# Patient Record
Sex: Male | Born: 1950 | Race: White | Hispanic: No | Marital: Married | Smoking: Never smoker
Health system: Southern US, Community
[De-identification: ages and names within clinical notes are randomized; demographics above are authoritative.]

## PROBLEM LIST (undated history)

## (undated) DIAGNOSIS — M1990 Unspecified osteoarthritis, unspecified site: Secondary | ICD-10-CM

## (undated) DIAGNOSIS — F419 Anxiety disorder, unspecified: Secondary | ICD-10-CM

## (undated) DIAGNOSIS — H9190 Unspecified hearing loss, unspecified ear: Secondary | ICD-10-CM

## (undated) DIAGNOSIS — R7303 Prediabetes: Secondary | ICD-10-CM

## (undated) DIAGNOSIS — K635 Polyp of colon: Secondary | ICD-10-CM

## (undated) DIAGNOSIS — I10 Essential (primary) hypertension: Secondary | ICD-10-CM

## (undated) DIAGNOSIS — Z87442 Personal history of urinary calculi: Secondary | ICD-10-CM

## (undated) DIAGNOSIS — G4730 Sleep apnea, unspecified: Secondary | ICD-10-CM

## (undated) DIAGNOSIS — F32A Depression, unspecified: Secondary | ICD-10-CM

## (undated) DIAGNOSIS — H269 Unspecified cataract: Secondary | ICD-10-CM

## (undated) DIAGNOSIS — C61 Malignant neoplasm of prostate: Secondary | ICD-10-CM

## (undated) DIAGNOSIS — E7800 Pure hypercholesterolemia, unspecified: Secondary | ICD-10-CM

## (undated) DIAGNOSIS — M2550 Pain in unspecified joint: Secondary | ICD-10-CM

## (undated) HISTORY — PX: SHOULDER SURGERY: SHX246

## (undated) HISTORY — DX: Sleep apnea, unspecified: G47.30

## (undated) HISTORY — PX: WISDOM TOOTH EXTRACTION: SHX21

## (undated) HISTORY — PX: EYE SURGERY: SHX253

## (undated) HISTORY — PX: REPLACEMENT TOTAL KNEE: SUR1224

## (undated) HISTORY — DX: Unspecified osteoarthritis, unspecified site: M19.90

## (undated) HISTORY — PX: PROSTATECTOMY: SHX69

## (undated) HISTORY — DX: Polyp of colon: K63.5

## (undated) HISTORY — PX: LASIK: SHX215

## (undated) HISTORY — DX: Depression, unspecified: F32.A

## (undated) HISTORY — DX: Malignant neoplasm of prostate: C61

## (undated) HISTORY — PX: TOTAL HIP ARTHROPLASTY: SHX124

## (undated) HISTORY — DX: Unspecified cataract: H26.9

## (undated) HISTORY — DX: Pain in unspecified joint: M25.50

## (undated) HISTORY — PX: JOINT REPLACEMENT: SHX530

---

## 2011-12-29 DIAGNOSIS — N529 Male erectile dysfunction, unspecified: Secondary | ICD-10-CM | POA: Insufficient documentation

## 2013-01-16 HISTORY — PX: REPLACEMENT TOTAL KNEE: SUR1224

## 2014-02-15 DIAGNOSIS — E785 Hyperlipidemia, unspecified: Secondary | ICD-10-CM | POA: Insufficient documentation

## 2014-06-01 DIAGNOSIS — Z96651 Presence of right artificial knee joint: Secondary | ICD-10-CM | POA: Insufficient documentation

## 2015-06-21 DIAGNOSIS — K5730 Diverticulosis of large intestine without perforation or abscess without bleeding: Secondary | ICD-10-CM | POA: Insufficient documentation

## 2015-06-21 DIAGNOSIS — D126 Benign neoplasm of colon, unspecified: Secondary | ICD-10-CM | POA: Insufficient documentation

## 2015-10-23 DIAGNOSIS — E538 Deficiency of other specified B group vitamins: Secondary | ICD-10-CM | POA: Insufficient documentation

## 2016-05-30 DIAGNOSIS — C61 Malignant neoplasm of prostate: Secondary | ICD-10-CM | POA: Insufficient documentation

## 2018-05-22 DIAGNOSIS — Z86711 Personal history of pulmonary embolism: Secondary | ICD-10-CM | POA: Insufficient documentation

## 2019-07-24 DIAGNOSIS — H25812 Combined forms of age-related cataract, left eye: Secondary | ICD-10-CM | POA: Insufficient documentation

## 2019-11-17 DIAGNOSIS — R7303 Prediabetes: Secondary | ICD-10-CM | POA: Insufficient documentation

## 2020-08-02 DIAGNOSIS — I10 Essential (primary) hypertension: Secondary | ICD-10-CM | POA: Insufficient documentation

## 2020-09-02 ENCOUNTER — Emergency Department
Admission: EM | Admit: 2020-09-02 | Discharge: 2020-09-02 | Disposition: A | Discharge status: Home / Self Care | Payer: Medicare Other | Source: Home / Self Care

## 2020-09-02 DIAGNOSIS — M5417 Radiculopathy, lumbosacral region: Secondary | ICD-10-CM | POA: Diagnosis not present

## 2020-09-02 DIAGNOSIS — M62830 Muscle spasm of back: Secondary | ICD-10-CM | POA: Diagnosis not present

## 2020-09-02 HISTORY — DX: Essential (primary) hypertension: I10

## 2020-09-02 HISTORY — DX: Pure hypercholesterolemia, unspecified: E78.00

## 2020-09-02 MED ORDER — PREDNISONE 20 MG PO TABS: ORAL_TABLET | ORAL | 0 refills | Status: DC

## 2020-09-02 MED ORDER — METHYLPREDNISOLONE SODIUM SUCC 125 MG IJ SOLR
125.0000 mg | Freq: Once | INTRAMUSCULAR | Status: AC
  Administered 2020-09-02: 125 mg via INTRAMUSCULAR

## 2020-09-02 MED ORDER — BACLOFEN 10 MG PO TABS: 10.0000 mg | ORAL_TABLET | Freq: Three times a day (TID) | ORAL | 0 refills | Status: DC

## 2020-09-02 NOTE — ED Provider Notes (Signed)
[name]    CSN: [id number] Arrival date & time: 09/02/20  1642      History   Chief Complaint Chief Complaint  Patient presents with   Back Pain    HPI Randall Hanson is a 70 y.o. male.   HPI Very pleasant 70 year old male presents with low back pain for 1 week.  Reports taking Tylenol for pain with little to no relief.  Reports lower back pain increases with movement and quickly worsens.  Patient is accompanied by his wife this afternoon.  Past Medical History:  Diagnosis Date   High cholesterol    Hypertension     There are no problems to display for this patient.   Past Surgical History:  Procedure Laterality Date   PROSTATECTOMY     REPLACEMENT TOTAL KNEE     SHOULDER SURGERY         Home Medications    Prior to Admission medications   Medication Sig Start Date End Date Taking? Authorizing Provider  baclofen (LIORESAL) 10 MG tablet Take 1 tablet (10 mg total) by mouth 3 (three) times daily. 09/02/20  Yes Eliezer Lofts, FNP  lisinopril (ZESTRIL) 10 MG tablet Take 10 mg by mouth daily.   Yes [provider]  predniSONE (DELTASONE) 20 MG tablet Take 3 tabs PO daily x 3 days, then 2 tabs PO daily x 3 days, then 1 tab PO daily x 3 days 09/02/20  Yes Eliezer Lofts, FNP  simvastatin (ZOCOR) 40 MG tablet Take 40 mg by mouth daily.   Yes [provider]  traZODone (DESYREL) 25 mg TABS tablet Take 25 mg by mouth at bedtime.   Yes [provider]  vitamin B-12 (CYANOCOBALAMIN) 500 MCG tablet Take 500 mcg by mouth daily.   Yes [provider]    Family History History reviewed. No pertinent family history.  Social History Social History   Tobacco Use   Smoking status: Never   Smokeless tobacco: Never  Vaping Use   Vaping Use: Never used  Substance Use Topics   Alcohol use: Never   Drug use: Never     Allergies   Patient has no known allergies.   Review of Systems Review of Systems   Musculoskeletal:  Positive for back pain.  All other systems reviewed and are negative.   Physical Exam Triage Vital Signs ED Triage Vitals  Enc Vitals Group     BP 09/02/20 1700 (!) 168/80     Pulse Rate 09/02/20 1700 61     Resp 09/02/20 1700 14     Temp 09/02/20 1700 98.2 F (36.8 C)     Temp Source 09/02/20 1700 Oral     SpO2 09/02/20 1700 98 %     Weight 09/02/20 1703 211 lb (95.7 kg)     Height 09/02/20 1703 '5\' 10"'$  (1.778 m)     Head Circumference --      Peak Flow --      Pain Score 09/02/20 1702 3     Pain Loc --      Pain Edu? --      Excl. in [place]? --    No data found.  Updated Vital Signs BP (!) 168/80 (BP Location: Left Arm)   Pulse 61   Temp 98.2 F (36.8 C) (Oral)   Resp 14   Ht '5\' 10"'$  (1.778 m)   Wt 211 lb (95.7 kg)   SpO2 98%   BMI 30.28 kg/m      Physical  Exam Vitals and nursing note reviewed.  Constitutional:      General: He is not in acute distress.    Appearance: Normal appearance. He is normal weight. He is not ill-appearing.  HENT:     Head: Normocephalic and atraumatic.     Mouth/Throat:     Mouth: Mucous membranes are moist.     Pharynx: Oropharynx is clear.  Eyes:     Extraocular Movements: Extraocular movements intact.     Conjunctiva/sclera: Conjunctivae normal.     Pupils: Pupils are equal, round, and reactive to light.  Cardiovascular:     Rate and Rhythm: Normal rate and regular rhythm.     Pulses: Normal pulses.     Heart sounds: Normal heart sounds.  Pulmonary:     Effort: Pulmonary effort is normal.     Breath sounds: Normal breath sounds. No wheezing, rhonchi or rales.  Musculoskeletal:        General: Normal range of motion.     Cervical back: Normal range of motion and neck supple. No rigidity or tenderness.     Comments: Lumbar sacral spine: TTP over spinous processes, paraspinous muscles bilaterally, and inferior spinal erectors bilaterally, flattening of normal lordosis, numerous palpable muscle adhesions, no  deformity noted  Lymphadenopathy:     Cervical: No cervical adenopathy.  Skin:    General: Skin is warm and dry.  Neurological:     General: No focal deficit present.     Mental Status: He is alert and oriented to person, place, and time. Mental status is at baseline.  Psychiatric:        Mood and Affect: Mood normal.        Behavior: Behavior normal.        Thought Content: Thought content normal.     UC Treatments / Results  Labs (all labs ordered are listed, but only abnormal results are displayed) Labs Reviewed - No data to display  EKG   Radiology No results found.  Procedures Procedures (including critical care time)  Medications Ordered in UC Medications  methylPREDNISolone sodium succinate (SOLU-MEDROL) 125 mg/2 mL injection 125 mg (125 mg Intramuscular Given 09/02/20 1730)    Initial Impression / Assessment and Plan / UC Course  I have reviewed the triage vital signs and the nursing notes.  Pertinent labs & imaging results that were available during my care of the patient were reviewed by me and considered in my medical decision making (see chart for details).     MDM: 1.  Lumbar sacral radiculopathy-IM Solu-Medrol 125 given once in clinic prior to discharge, Rx'd Prednisone taper; 2.  Muscle spasm of back-Rx'd Baclofen. Advised/instructed patient to start oral prednisone taper tomorrow, Friday, 09/03/2020.  Advised patient to take medications as directed with food to completion.  Encouraged patient to increase daily water intake while taking these medications.  Advised/encouraged patient to avoid moderate to strenuous activities, including lifting, bending over, pushing, pulling, or other repetitive motion activities involving affected area of lower back for the next 7 to 10 days.   Final Clinical Impressions(s) / UC Diagnoses   Final diagnoses:  Muscle spasm of back  Lumbosacral radiculopathy     Discharge Instructions      Advised/instructed patient  to start oral prednisone taper tomorrow, Friday, 09/03/2020.  Advised patient to take medications as directed with food to completion.  Encouraged patient to increase daily water intake while taking these medications.  Advised/encouraged patient to avoid moderate to strenuous activities, including lifting, bending over, pushing,  pulling, or other repetitive motion activities involving affected area of lower back for the next 7 to 10 days.     ED Prescriptions     Medication Sig Dispense Auth. Provider   predniSONE (DELTASONE) 20 MG tablet Take 3 tabs PO daily x 3 days, then 2 tabs PO daily x 3 days, then 1 tab PO daily x 3 days 18 tablet Eliezer Lofts, FNP   baclofen (LIORESAL) 10 MG tablet Take 1 tablet (10 mg total) by mouth 3 (three) times daily. 61 each Eliezer Lofts, FNP      PDMP not reviewed this encounter.   Eliezer Lofts, [place] 09/02/20 1742

## 2020-09-02 NOTE — Discharge Instructions (Addendum)
Advised/instructed patient to start oral prednisone taper tomorrow, Friday, 09/03/2020.  Advised patient to take medications as directed with food to completion.  Encouraged patient to increase daily water intake while taking these medications.  Advised/encouraged patient to avoid moderate to strenuous activities, including lifting, bending over, pushing, pulling, or other repetitive motion activities involving affected area of lower back for the next 7 to 10 days.

## 2020-09-02 NOTE — ED Triage Notes (Signed)
Pt presents with lower back pain x1 week. Pt is taking tylenol for pain. Pain increases with movement and becomes severe

## 2020-09-16 ENCOUNTER — Ambulatory Visit (INDEPENDENT_AMBULATORY_CARE_PROVIDER_SITE_OTHER): Payer: Medicare Other | Admitting: Physician Assistant

## 2020-09-16 ENCOUNTER — Encounter: Payer: Self-pay | Admitting: Physician Assistant

## 2020-09-16 ENCOUNTER — Ambulatory Visit (INDEPENDENT_AMBULATORY_CARE_PROVIDER_SITE_OTHER): Payer: Medicare Other

## 2020-09-16 ENCOUNTER — Other Ambulatory Visit: Payer: Self-pay

## 2020-09-16 VITALS — BP 136/65 | HR 73 | Ht 70.0 in | Wt 209.0 lb

## 2020-09-16 DIAGNOSIS — G8929 Other chronic pain: Secondary | ICD-10-CM | POA: Insufficient documentation

## 2020-09-16 DIAGNOSIS — I10 Essential (primary) hypertension: Secondary | ICD-10-CM

## 2020-09-16 DIAGNOSIS — Z23 Encounter for immunization: Secondary | ICD-10-CM | POA: Diagnosis not present

## 2020-09-16 DIAGNOSIS — M8949 Other hypertrophic osteoarthropathy, multiple sites: Secondary | ICD-10-CM | POA: Insufficient documentation

## 2020-09-16 DIAGNOSIS — M1612 Unilateral primary osteoarthritis, left hip: Secondary | ICD-10-CM | POA: Insufficient documentation

## 2020-09-16 DIAGNOSIS — G4700 Insomnia, unspecified: Secondary | ICD-10-CM | POA: Insufficient documentation

## 2020-09-16 DIAGNOSIS — H903 Sensorineural hearing loss, bilateral: Secondary | ICD-10-CM | POA: Insufficient documentation

## 2020-09-16 DIAGNOSIS — M5442 Lumbago with sciatica, left side: Secondary | ICD-10-CM

## 2020-09-16 DIAGNOSIS — G4733 Obstructive sleep apnea (adult) (pediatric): Secondary | ICD-10-CM | POA: Diagnosis not present

## 2020-09-16 DIAGNOSIS — F411 Generalized anxiety disorder: Secondary | ICD-10-CM | POA: Insufficient documentation

## 2020-09-16 DIAGNOSIS — M47816 Spondylosis without myelopathy or radiculopathy, lumbar region: Secondary | ICD-10-CM | POA: Insufficient documentation

## 2020-09-16 DIAGNOSIS — M4316 Spondylolisthesis, lumbar region: Secondary | ICD-10-CM | POA: Insufficient documentation

## 2020-09-16 DIAGNOSIS — F419 Anxiety disorder, unspecified: Secondary | ICD-10-CM | POA: Insufficient documentation

## 2020-09-16 DIAGNOSIS — M159 Polyosteoarthritis, unspecified: Secondary | ICD-10-CM

## 2020-09-16 MED ORDER — BELSOMRA 10 MG PO TABS: 1.0000 | ORAL_TABLET | Freq: Every day | ORAL | 5 refills | Status: DC

## 2020-09-16 MED ORDER — ALPRAZOLAM 0.25 MG PO TABS: ORAL_TABLET | ORAL | 0 refills | Status: DC

## 2020-09-16 MED ORDER — MELOXICAM 15 MG PO TABS: 15.0000 mg | ORAL_TABLET | Freq: Every day | ORAL | 2 refills | Status: DC

## 2020-09-16 NOTE — Progress Notes (Signed)
New Patient Office Visit  Subjective:  Patient ID: Randall Hanson, male    DOB: 25-Jan-1950  Age: 70 y.o. MRN: [id number]  CC:  Chief Complaint  Patient presents with   Establish Care    HPI [name] presents to establish care.   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 He was very upset that day over many things. He has been checking BP at home and running 120s/130s over 70s. No CP, palpitations, headaches, vision changes.   He is also having trouble sleeping. Not getting to sleep but staying asleep. He wakes up and just cannot get back to sleep. He tried trazodone and made his eyes water and vision was blurry.   His low back pain with radiation into left buttocks was better but starting to come back. Hx of low back pain but not the radiation and not this bad. He went to urgent care and solumedrol shot '125mg'$  worked amazing all over his body but his pain is starting to come back. He is taking tylenol but nothing else to help with pain. He denies any injuries. He has OA all over his body with hands, knees, shoulders and back. Denies any bowel or bladder dysfunction, saddle anesthesia, leg weakness.   Past Medical History:  Diagnosis Date   High cholesterol    Hypertension     Past Surgical History:  Procedure Laterality Date   PROSTATECTOMY     REPLACEMENT TOTAL KNEE     SHOULDER SURGERY      History reviewed. No pertinent family history.  Social History   Socioeconomic History   Marital status: Married    Spouse name: Not on file   Number of children: Not on file   Years of education: Not on file   Highest education level: Not on file  Occupational History   Not on file  Tobacco Use   Smoking status: Never   Smokeless tobacco: Never  Vaping Use   Vaping Use: Never used  Substance and Sexual Activity   Alcohol use: Never   Drug use: Never   Sexual activity: Not on file  Other Topics Concern   Not on file   Social History Narrative   Not on file   Social Determinants of Health   Financial Resource Strain: Not on file  Food Insecurity: Not on file  Transportation Needs: Not on file  Physical Activity: Not on file  Stress: Not on file  Social Connections: Not on file  Intimate Partner Violence: Not on file    ROS Review of Systems See HPI. Objective:   Today's Vitals: BP 136/65   Pulse 73   Ht '5\' 10"'$  (1.778 m)   Wt 209 lb (94.8 kg)   SpO2 99%   BMI 29.99 kg/m   Physical Exam Vitals reviewed.  Constitutional:      Appearance: Normal appearance.  HENT:     Head: Normocephalic.  Neck:     Vascular: No carotid bruit.  Cardiovascular:     Rate and Rhythm: Normal rate and regular rhythm.     Pulses: Normal pulses.     Heart sounds: Normal heart sounds.  Pulmonary:     Effort: Pulmonary effort is normal.     Breath sounds: Normal breath sounds.  Musculoskeletal:     Right lower leg: No edema.     Left lower leg: No edema.     Comments: Upper and lower  extremity 5/5  Neurological:     General: No focal deficit present.     Mental Status: He is alert and oriented to person, place, and time.  Psychiatric:        Mood and Affect: Mood normal.    Assessment & Plan:  Marland KitchenMarland KitchenKahmani was seen today for establish care.  Diagnoses and all orders for this visit:  Benign essential hypertension  Flu vaccine need -     Flu Vaccine QUAD High Dose(Fluad)  OSA (obstructive sleep apnea)  Insomnia, unspecified type -     Suvorexant (BELSOMRA) 10 MG TABS; Take 1 tablet by mouth at bedtime.  Acute left-sided low back pain with left-sided sciatica -     DG Lumbar Spine Complete; Future -     meloxicam (MOBIC) 15 MG tablet; Take 1 tablet (15 mg total) by mouth daily.  Primary osteoarthritis involving multiple joints -     meloxicam (MOBIC) 15 MG tablet; Take 1 tablet (15 mg total) by mouth daily.  Anxiety -     ALPRAZolam (XANAX) 0.25 MG tablet; Take 30 minutes before event.  BP  is good in office and at home. Stay on same dose. Take xanax 30 minutes before visit to calm nerves. Discussed side effects.   Discussed sleep hygiene. Try belsomra. Discussed giving in 2 weeks to work.   Get xray for low back pain. With extensive OA suggest follow up with Dr. Darene Lamer.  Trial of mobic. Will watch kidneys.  Discussed low back exercises.  Encouraged tumeric OTC.     Follow-up: Return in about 2 weeks (around 09/30/2020) for DR. T for osteoathritis/ 3 months with PCP.   Iran Planas, PA-C

## 2020-09-16 NOTE — Patient Instructions (Signed)
Tumeric natrual antiinflammatory '500mg'$  twice a day.

## 2020-09-21 ENCOUNTER — Encounter: Payer: Self-pay | Admitting: Physician Assistant

## 2020-09-21 DIAGNOSIS — I700 Atherosclerosis of aorta: Secondary | ICD-10-CM | POA: Insufficient documentation

## 2020-09-21 DIAGNOSIS — M5136 Other intervertebral disc degeneration, lumbar region: Secondary | ICD-10-CM | POA: Insufficient documentation

## 2020-09-21 NOTE — Progress Notes (Signed)
Randall Hanson,   You have mild disc slippage at L4 or L5. You have arthritis seen throughout lumbar spine. Mobic that was given can help with this along with exercises and at times other injections and medications. Follow up with Dr. Darene Lamer for further management.   Plaque accumulation seen in aorta. Continue on statin for prevention.

## 2020-09-30 ENCOUNTER — Ambulatory Visit (INDEPENDENT_AMBULATORY_CARE_PROVIDER_SITE_OTHER): Payer: Medicare Other | Admitting: Sports Medicine

## 2020-09-30 ENCOUNTER — Other Ambulatory Visit: Payer: Self-pay

## 2020-09-30 DIAGNOSIS — M4316 Spondylolisthesis, lumbar region: Secondary | ICD-10-CM

## 2020-09-30 MED ORDER — TRAMADOL HCL 50 MG PO TABS
50.0000 mg | ORAL_TABLET | Freq: Two times a day (BID) | ORAL | 0 refills | Status: DC | PRN
Start: 2020-09-30 — End: 2020-10-22

## 2020-09-30 NOTE — Progress Notes (Signed)
    Procedures performed today:    None.  Independent interpretation of notes and tests performed by another provider:   None.  Brief History, Exam, Impression, and Recommendations:    Spondylolisthesis at L4-L5 level This is a very pleasant 70 year old male, he has a long history of axial low back pain with radiation into the left buttock, he was seen in urgent care, steroid shot helped dramatically, he was then placed on meloxicam which is also helped significantly. X-rays were done that show L4-L5 spinal listhesis with L4-L5 facet arthritis. We discussed the anatomy, as well as the evolutionary anthropology of low back pain, we will do aggressive formal physical therapy, continue meloxicam, adding tramadol for breakthrough pain. Return to see me in 6 weeks, MRI for interventional planning if no better.    ___________________________________________ Gwen Her. Dianah Field, M.D., ABFM., CAQSM. Primary Care and [place] Instructor of [place] [employer] [facility]

## 2020-09-30 NOTE — Assessment & Plan Note (Signed)
This is a very pleasant 70 year old male, he has a long history of axial low back pain with radiation into the left buttock, he was seen in urgent care, steroid shot helped dramatically, he was then placed on meloxicam which is also helped significantly. X-rays were done that show L4-L5 spinal listhesis with L4-L5 facet arthritis. We discussed the anatomy, as well as the evolutionary anthropology of low back pain, we will do aggressive formal physical therapy, continue meloxicam, adding tramadol for breakthrough pain. Return to see me in 6 weeks, MRI for interventional planning if no better.

## 2020-10-05 ENCOUNTER — Ambulatory Visit: Payer: Medicare Other | Admitting: Rehabilitative and Restorative Service Providers"

## 2020-10-05 ENCOUNTER — Other Ambulatory Visit: Payer: Self-pay

## 2020-10-05 DIAGNOSIS — R29898 Other symptoms and signs involving the musculoskeletal system: Secondary | ICD-10-CM

## 2020-10-05 DIAGNOSIS — M5440 Lumbago with sciatica, unspecified side: Secondary | ICD-10-CM

## 2020-10-05 NOTE — Patient Instructions (Signed)
Access Code: [email or web address] URL: [email or web address] Date: 10/05/2020 Prepared by: Rudell Cobb  Exercises Supine Hamstring Stretch with Strap - 1-2 x daily - 7 x weekly - 1 sets - 3 reps - 30 seconds hold Beginner Bridge - 1-2 x daily - 7 x weekly - 1 sets - 10 reps - 3 second hold [employer] on Table - 1-2 x daily - 7 x weekly - 1 sets - 3 reps - 30 seconds hold Gastroc Stretch on Wall - 2 x daily - 7 x weekly - 1 sets - 3 reps - 30 seconds hold Wall Quarter Squat - 2 x daily - 7 x weekly - 1 sets - 10 reps

## 2020-10-05 NOTE — Therapy (Signed)
Randall Hanson, [place], [address] Phone: [phone]   Fax:  [phone]  Physical Therapy Evaluation  Patient Details  Name: Randall Hanson MRN: [id number] Date of Birth: 18-Dec-1950 Referring Provider (PT): Aundria Mems, MD   Encounter Date: 10/05/2020   PT End of Session - 10/05/20 1304     Visit Number 1    Number of Visits 12    Date for PT Re-Evaluation 11/16/20    Authorization Type U[employer] Medicare    PT Start Time 0850    PT Stop Time 0930    PT Time Calculation (min) 40 min             Past Medical History:  Diagnosis Date   Arthritis    Colon polyps    High cholesterol    Hypertension    Joint pain    Prostate cancer [facility])    Sleep apnea     Past Surgical History:  Procedure Laterality Date   PROSTATECTOMY     REPLACEMENT TOTAL KNEE     SHOULDER SURGERY      There were no vitals filed for this visit.    Subjective Assessment - 10/05/20 0851     Subjective The patient reports he has chronic low back pain that has worsened recently.  He notes occasional pain into buttocks.  A few weeks ago, his back pain increased where he could not move well.  Can do morning work around the house, but stops by mid afternoon due to low back pain.  Pain does decrease with sitting.    Pertinent History HTN    Patient Stated Goals reduce pain    Currently in Pain? Yes    Pain Score 3    goes up to 8-9/10   Pain Location Back    Pain Orientation Lower    Pain Descriptors / Indicators Aching;Sore;Tightness    Pain Type Chronic pain    Pain Onset More than a month ago    Pain Frequency Intermittent    Aggravating Factors  work around the house; laying still    Pain Relieving Factors medicine (meloxicam helps)                [facility] PT Assessment - 10/05/20 0901       Assessment   Medical Diagnosis LBP radiating into L buttock, spondylolisthesis    Referring Provider (PT)  Aundria Mems, MD    Onset Date/Surgical Date 09/30/20    Hand Dominance Right;Left    Prior Therapy none for LBP      Precautions   Precautions None      Restrictions   Weight Bearing Restrictions No      Balance Screen   Has the patient fallen in the past 6 months No    Has the patient had a decrease in activity level because of a fear of falling?  No    Is the patient reluctant to leave their home because of a fear of falling?  No      Home Ecologist residence    Living Arrangements Spouse/significant other    Home Access Stairs to enter    Entrance Stairs-Number of Steps 12    Home Layout Two level    [place] None      Prior Function   Level of [place] Retired      Observation/Other Assessments   Focus on Therapeutic Outcomes (  FOTO)  50%      Sensation   Light Touch Appears Intact      Posture/Postural Control   Posture/Postural Control Postural limitations    Posture Comments toes out R foot      ROM / Strength   AROM / PROM / Strength AROM;Strength      AROM   Overall AROM  Deficits    AROM Assessment Site Lumbar;Hip;Knee    Right/Left Hip Right;Left   tightness noted in bilat hips with IR/ER prone (more restricted R)   Right/Left Knee Right    Right Knee Extension -5    Right Knee Flexion 90   toes out R distal LE   Lumbar Flexion 50% limitation    Lumbar Extension 50% limitation    Lumbar - Right Side Bend 50% limitation    Lumbar - Left Side Bend 50% limitation    Lumbar - Right Rotation 50% limitation    Lumbar - Left Rotation 50% limitation      Strength   Overall Strength Deficits    Strength Assessment Site Hip;Knee;Ankle    Right/Left Hip Right;Left    Right Hip Flexion 5/5    Left Hip Flexion 4/5    Right/Left Knee Right;Left    Right Knee Flexion 5/5    Right Knee Extension 5/5    Left Knee Flexion 5/5    Left Knee Extension 5/5    Right/Left Ankle Right;Left     Right Ankle Dorsiflexion 4/5    Left Ankle Dorsiflexion 4/5      Flexibility   Soft Tissue Assessment /Muscle Length yes    Hamstrings -30 degrees on R and -22 on L    Quadriceps tightness noted Bilat-- R also tight due to joint limitaiton from prior TKA      Palpation   Spinal mobility CPA is limited in lumbar-thoracic junction    Palpation comment tender to palpation L QL                        Objective measurements completed on examination: See above findings.       [facility] Adult PT Treatment/Exercise - 10/05/20 0901       Exercises   Exercises Knee/Hip      Knee/Hip Exercises: Stretches   Active Hamstring Stretch Right;Left;2 reps;30 seconds    Quad Stretch Right;Left;1 rep;20 seconds    Hip Flexor Stretch Right;Left;2 reps;30 seconds    Gastroc Stretch Right;Left;2 reps;30 seconds      Knee/Hip Exercises: Standing   Wall Squat 10 reps      Knee/Hip Exercises: Supine   Bridges Strengthening;10 reps                     PT Education - 10/05/20 1302     Education Details HEP    Person(s) Educated Patient    Methods Explanation;Demonstration;Handout    Comprehension Verbalized understanding;Returned demonstration                 PT Long Term Goals - 10/05/20 1304       PT LONG TERM GOAL #1   Title The patient will be indep with HEP for core stability, flexibility, and LE strengthening.    Time 6    Period Weeks    Target Date 11/16/20      PT LONG TERM GOAL #2   Title The patient will improve functional status score from 50% up to 64% to demonstrate improved functional abilities.  Time 6    Period Weeks    Target Date 11/16/20      PT LONG TERM GOAL #3   Title The patient will improve hamstring length to -20 degrees bilaterally (90/90 supine starting position).    Time 6    Period Weeks    Target Date 11/16/20      PT LONG TERM GOAL #4   Title The patient will report resting back pain < or equal to 1/10.     Baseline 3/10    Time 6    Period Weeks    Target Date 11/16/20      PT LONG TERM GOAL #5   Title The patient will be able to tolerate >1 hour of standing activities with back pain < or equal to 2/10.    Time 6    Period Weeks    Target Date 11/16/20                    Plan - 10/05/20 1316     Clinical Impression Statement The patient is a 70 yo male presenting to OP physical therapy with h/o chronic LBP that worsened in the past month. He presents with imapirments in ROM bilateral hips, ROM lumbar spine, ROM R knee, dec'd flexibility LEs, L hip weakness leading to pain with prolonged standing and pain during household chores.  PT to address deficits to promote return to prior functional status.    Personal Factors and Comorbidities Comorbidity 1;Time since onset of injury/illness/exacerbation    Comorbidities HTN    Examination-Activity Limitations Stairs;Stand;Locomotion Level;Bend;Squat    Examination-Participation Restrictions Yard Work;Community Activity    Stability/Clinical Decision Making Stable/Uncomplicated    Clinical Decision Making Low    Rehab Potential Good    PT Frequency 2x / week    PT Duration 6 weeks    PT Treatment/Interventions Patient/family education;ADLs/Self Care Home Management;Therapeutic activities;Therapeutic exercise;Gait training;Taping;Manual techniques;Aquatic Therapy;Electrical Stimulation;Moist Heat    PT Next Visit Plan progress core stability, stretching quads, glut activation, hip abductor strengthening, thoracic/lumbar junction mobilization    PT Home Exercise Plan [facility]    Consulted and Agree with Plan of Care Patient             Patient will benefit from skilled therapeutic intervention in order to improve the following deficits and impairments:  Pain, Hypomobility, Impaired flexibility, Postural dysfunction, Decreased strength, Decreased range of motion, Increased fascial restricitons, Decreased activity tolerance  Visit  Diagnosis: Bilateral low back pain with sciatica, sciatica laterality unspecified, unspecified chronicity  Other symptoms and signs involving the musculoskeletal system     Problem List Patient Active Problem List   Diagnosis Date Noted   Aortic atherosclerosis ([place]) 09/21/2020   DDD (degenerative disc disease), lumbar 09/21/2020   OSA (obstructive sleep apnea) 09/16/2020   Sensorineural hearing loss (SNHL), bilateral 09/16/2020   Insomnia 09/16/2020   Primary osteoarthritis involving multiple joints 09/16/2020   Spondylolisthesis at L4-L5 level 09/16/2020   Anxiety 09/16/2020   Benign essential hypertension 08/02/2020   Prediabetes 11/17/2019   Combined forms of age-related cataract of left eye 07/24/2019   Personal history of pulmonary embolism 05/22/2018   Prostate cancer ([place]) 05/30/2016   Vitamin B 12 deficiency 10/23/2015   Diverticulosis of large intestine without hemorrhage 06/21/2015   Tubular adenoma of colon 06/21/2015   History of total knee arthroplasty, right 06/01/2014   Dyslipidemia 02/15/2014   ED (erectile dysfunction) 12/29/2011    Jalexa Pifer, PT 10/05/2020, 1:26 PM  [facility]  [place], [place], [address] Phone: [phone]   Fax:  [phone]  Name: Zephan Beauchaine MRN: [id number] Date of Birth: Sep 26, 1950

## 2020-10-12 ENCOUNTER — Other Ambulatory Visit: Payer: Self-pay

## 2020-10-12 ENCOUNTER — Ambulatory Visit: Payer: Medicare Other | Admitting: Physical Therapy

## 2020-10-12 DIAGNOSIS — R29898 Other symptoms and signs involving the musculoskeletal system: Secondary | ICD-10-CM | POA: Diagnosis not present

## 2020-10-12 DIAGNOSIS — M5440 Lumbago with sciatica, unspecified side: Secondary | ICD-10-CM | POA: Diagnosis not present
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
## 2020-10-14 ENCOUNTER — Telehealth: Payer: Self-pay | Admitting: Physician Assistant

## 2020-10-14 NOTE — Telephone Encounter (Signed)
JJ, I guess be on look out for paperwork.

## 2020-10-14 NOTE — Telephone Encounter (Signed)
Patient dropped off paperwork this morning with Lavonna Rua and Jenny Reichmann let me know when I returned to work, that patient needed paperwork filled out, stated Jade prescribed him one med that is listed on the paper and Dr T prescribed the other one. 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 Given to [employer] and let her know what was going on. AM *10/14/20

## 2020-10-14 NOTE — Telephone Encounter (Signed)
T filled it out and I returned it right after lunch.

## 2020-10-19 ENCOUNTER — Ambulatory Visit: Payer: Medicare Other | Admitting: Physical Therapy

## 2020-10-19 ENCOUNTER — Other Ambulatory Visit: Payer: Self-pay

## 2020-10-19 ENCOUNTER — Encounter (INDEPENDENT_AMBULATORY_CARE_PROVIDER_SITE_OTHER): Payer: Medicare Other

## 2020-10-19 DIAGNOSIS — M5440 Lumbago with sciatica, unspecified side: Secondary | ICD-10-CM

## 2020-10-19 DIAGNOSIS — M4316 Spondylolisthesis, lumbar region: Secondary | ICD-10-CM

## 2020-10-19 DIAGNOSIS — R29898 Other symptoms and signs involving the musculoskeletal system: Secondary | ICD-10-CM

## 2020-10-19 NOTE — Patient Instructions (Signed)
Access Code: [email or web address] URL: [email or web address] Date: 10/19/2020 Prepared by: Isabelle Course  Exercises Supine Hamstring Stretch with Strap - 1-2 x daily - 7 x weekly - 1 sets - 3 reps - 30 seconds hold Beginner Bridge - 1-2 x daily - 7 x weekly - 1 sets - 10 reps - 3 second hold [employer] on Table - 1-2 x daily - 7 x weekly - 1 sets - 3 reps - 30 seconds hold Gastroc Stretch on Wall - 2 x daily - 7 x weekly - 1 sets - 3 reps - 30 seconds hold Wall Quarter Squat - 2 x daily - 7 x weekly - 1 sets - 10 reps  Patient Education TENS Unit Trigger Point Dry Needling
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
## 2020-10-22 MED ORDER — TRAMADOL HCL 50 MG PO TABS: 50.0000 mg | ORAL_TABLET | Freq: Three times a day (TID) | ORAL | 0 refills | Status: DC

## 2020-10-22 NOTE — Telephone Encounter (Signed)
I spent 5 total minutes of online digital evaluation and management services. 

## 2020-10-26 ENCOUNTER — Ambulatory Visit: Payer: Medicare Other | Admitting: Physical Therapy

## 2020-10-26 ENCOUNTER — Other Ambulatory Visit: Payer: Self-pay

## 2020-10-26 DIAGNOSIS — R29898 Other symptoms and signs involving the musculoskeletal system: Secondary | ICD-10-CM | POA: Diagnosis not present

## 2020-10-26 DIAGNOSIS — M5440 Lumbago with sciatica, unspecified side: Secondary | ICD-10-CM

## 2020-10-26 NOTE — Patient Instructions (Signed)
Access Code: [email or web address] URL: [email or web address] Date: 10/26/2020 Prepared by: Isabelle Course  Exercises Supine Hamstring Stretch with Strap - 1-2 x daily - 7 x weekly - 1 sets - 3 reps - 30 seconds hold Beginner Bridge - 1-2 x daily - 7 x weekly - 1 sets - 10 reps - 3 second hold Gastroc Stretch on Wall - 2 x daily - 7 x weekly - 1 sets - 3 reps - 30 seconds hold Wall Quarter Squat - 2 x daily - 7 x weekly - 1 sets - 10 reps Seated Hip Flexor Stretch - 1 x daily - 7 x weekly - 3 sets - 10 reps  Patient Education TENS Unit Trigger Point Dry Needling

## 2020-10-26 NOTE — Therapy (Signed)
[place], [place], [address] Phone: [phone]   Fax:  [phone]  Physical Therapy Treatment  Patient Details  Name: Randall Hanson MRN: [id number] Date of Birth: January 17, 1951 Referring Provider (PT): Aundria Mems, MD   Encounter Date: 10/26/2020   PT End of Session - 10/26/20 0925     Visit Number 4    Number of Visits 12    Date for PT Re-Evaluation 11/16/20    Authorization Type UHC Medicare    Authorization - Number of Visits 4    Progress Note Due on Visit 10    PT Start Time 0845    PT Stop Time 0925    PT Time Calculation (min) 40 min    Activity Tolerance Patient tolerated treatment well    Behavior During Therapy [facility] for tasks assessed/performed             Past Medical History:  Diagnosis Date   Arthritis    Colon polyps    High cholesterol    Hypertension    Joint pain    Prostate cancer ([place])    Sleep apnea     Past Surgical History:  Procedure Laterality Date   PROSTATECTOMY     REPLACEMENT TOTAL KNEE     SHOULDER SURGERY      There were no vitals filed for this visit.   Subjective Assessment - 10/26/20 0845     Subjective Pt states he continues to have pain "all the time". He had increased pain after doing yard work this weekend. He has purchased a home TENS machine which he said provides some pain relief. He states he felt "loose for a few hours" after dry needling but no long lasting effects    Patient Stated Goals reduce pain    Currently in Pain? Yes    Pain Score 4     Pain Location Back    Pain Orientation Lower    Pain Descriptors / Indicators Aching;Sore                [facility] PT Assessment - 10/26/20 0001       Assessment   Medical Diagnosis LBP radiating into L buttock, spondylolisthesis    Referring Provider (PT) Aundria Mems, MD    Onset Date/Surgical Date 09/30/20    Hand Dominance Right;Left    Prior Therapy none  for LBP      AROM   Lumbar Flexion 10% limited    Lumbar Extension 50% limited    Lumbar - Right Side Bend 50% limited    Lumbar - Left Side Bend 50% limited    Lumbar - Right Rotation 25% limited    Lumbar - Left Rotation 25% limited      Strength   Right Hip Flexion 5/5    Left Hip Flexion 4+/5                           [facility] Adult PT Treatment/Exercise - 10/26/20 0001       Knee/Hip Exercises: Stretches   Active Hamstring Stretch Right;Left;2 reps;30 seconds    Quad Stretch Right;Left;2 reps;30 seconds    Hip Flexor Stretch Right;Left;2 reps;4 reps;30 seconds    Gastroc Stretch Right;Left;2 reps;20 seconds      Knee/Hip Exercises: Aerobic   Tread Mill 1.7 mph x 4 min      Knee/Hip Exercises: Standing   Hip Extension Right;Left;10 reps    Walking  with Sports Cord side step green TB 3 x 10 steps bilat    Other Standing Knee Exercises pallof press red TB x 10 bilat    Other Standing Knee Exercises shoulder extension x 15 green TB      Knee/Hip Exercises: Supine   Other Supine Knee/Hip Exercises LTR x 15 bilat                     PT Education - 10/26/20 0924     Education Details added hip flexor stretch to HEP    Person(s) Educated Patient    Methods Explanation;Handout;Demonstration    Comprehension Returned demonstration;Verbalized understanding                 PT Long Term Goals - 10/05/20 1304       PT LONG TERM GOAL #1   Title The patient will be indep with HEP for core stability, flexibility, and LE strengthening.    Time 6    Period Weeks    Target Date 11/16/20      PT LONG TERM GOAL #2   Title The patient will improve functional status score from 50% up to 64% to demonstrate improved functional abilities.    Time 6    Period Weeks    Target Date 11/16/20      PT LONG TERM GOAL #3   Title The patient will improve hamstring length to -20 degrees bilaterally (90/90 supine starting position).    Time 6    Period  Weeks    Target Date 11/16/20      PT LONG TERM GOAL #4   Title The patient will report resting back pain < or equal to 1/10.    Baseline 3/10    Time 6    Period Weeks    Target Date 11/16/20      PT LONG TERM GOAL #5   Title The patient will be able to tolerate >1 hour of standing activities with back pain < or equal to 2/10.    Time 6    Period Weeks    Target Date 11/16/20                   Plan - 10/26/20 0925     Clinical Impression Statement Due to patient with continued pain exercises and stretches were adjusted this session to focus on standing core stability and hip extension to improve mobility and core strength. Pt with good tolerance to new exercises, improving lumbar mobility    PT Next Visit Plan assess new exercises from last visit, progress core stability and stretching    PT Home Exercise Plan [place] and Agree with Plan of Care Patient             Patient will benefit from skilled therapeutic intervention in order to improve the following deficits and impairments:     Visit Diagnosis: Bilateral low back pain with sciatica, sciatica laterality unspecified, unspecified chronicity  Other symptoms and signs involving the musculoskeletal system     Problem List Patient Active Problem List   Diagnosis Date Noted   Aortic atherosclerosis ([place]) 09/21/2020   DDD (degenerative disc disease), lumbar 09/21/2020   OSA (obstructive sleep apnea) 09/16/2020   Sensorineural hearing loss (SNHL), bilateral 09/16/2020   Insomnia 09/16/2020   Primary osteoarthritis involving multiple joints 09/16/2020   Spondylolisthesis at L4-L5 level 09/16/2020   Anxiety 09/16/2020   Benign essential hypertension 08/02/2020   Prediabetes 11/17/2019  Combined forms of age-related cataract of left eye 07/24/2019   Personal history of pulmonary embolism 05/22/2018   Prostate cancer ([place]) 05/30/2016   Vitamin B 12 deficiency 10/23/2015   Diverticulosis of  large intestine without hemorrhage 06/21/2015   Tubular adenoma of colon 06/21/2015   History of total knee arthroplasty, right 06/01/2014   Dyslipidemia 02/15/2014   ED (erectile dysfunction) 12/29/2011    Terriyah Westra, PT 10/26/2020, 9:30 AM  [facility] [place], [place], [address] Phone: [phone]   Fax:  [phone]  Name: Randall Hanson MRN: [id number] Date of Birth: 1950/10/16

## 2020-11-03 ENCOUNTER — Ambulatory Visit: Payer: Medicare Other | Admitting: Physical Therapy

## 2020-11-03 ENCOUNTER — Other Ambulatory Visit: Payer: Self-pay

## 2020-11-03 DIAGNOSIS — M5440 Lumbago with sciatica, unspecified side: Secondary | ICD-10-CM | POA: Diagnosis not present

## 2020-11-03 DIAGNOSIS — R29898 Other symptoms and signs involving the musculoskeletal system: Secondary | ICD-10-CM | POA: Diagnosis not present

## 2020-11-03 NOTE — Therapy (Signed)
[place], [place], [address] Phone: [phone]   Fax:  [phone]  Physical Therapy Treatment  Patient Details  Name: Randall Hanson MRN: [id number] Date of Birth: 12/09/1950 Referring Provider (PT): Aundria Mems, MD   Encounter Date: 11/03/2020   PT End of Session - 11/03/20 0925     Visit Number 5    Number of Visits 12    Date for PT Re-Evaluation 11/16/20    Authorization Type [employer] Medicare    Authorization - Number of Visits 5    Progress Note Due on Visit 10    PT Start Time 0845    PT Stop Time 0925    PT Time Calculation (min) 40 min    Activity Tolerance Patient tolerated treatment well    Behavior During Therapy [facility] for tasks assessed/performed             Past Medical History:  Diagnosis Date   Arthritis    Colon polyps    High cholesterol    Hypertension    Joint pain    Prostate cancer ([place])    Sleep apnea     Past Surgical History:  Procedure Laterality Date   PROSTATECTOMY     REPLACEMENT TOTAL KNEE     SHOULDER SURGERY      There were no vitals filed for this visit.   Subjective Assessment - 11/03/20 0850     Subjective Pt states he did a lot of sitting in the past week and his back felt better. After doing some more yard work yesterday his pain returned    Patient Stated Goals reduce pain    Currently in Pain? Yes    Pain Score 5     Pain Location Back    Pain Orientation Lower    Pain Descriptors / Indicators Aching                [facility] PT Assessment - 11/03/20 0001       Assessment   Medical Diagnosis LBP radiating into L buttock, spondylolisthesis    Referring Provider (PT) Aundria Mems, MD    Onset Date/Surgical Date 09/30/20    Hand Dominance Right;Left    Prior Therapy none for LBP      Flexibility   Quadriceps decreased bilat Rt > Lt                           [facility] Adult PT Treatment/Exercise  - 11/03/20 0001       Knee/Hip Exercises: Stretches   Active Hamstring Stretch Right;Left;2 reps;30 seconds    Quad Stretch Right;Left;2 reps;30 seconds    Hip Flexor Stretch Right;Left;2 reps;30 seconds    Gastroc Stretch Right;Left;2 reps;20 seconds      Knee/Hip Exercises: Aerobic   Tread Mill 1.7 mph x 4 min      Knee/Hip Exercises: Standing   Wall Squat 5 sets    Wall Squat Limitations with 10# KB held at 90 degrees shoulder flexion 5 x 10sec    Walking with Sports Cord side step green TB 3 x 10 steps bilat    Other Standing Knee Exercises pallof press green TB x 10 bilat, dead lift 15# KB x 15 from floor    Other Standing Knee Exercises shoulder extension x 20 green TB      Manual Therapy   Soft tissue mobilization STM lumbar paraspinals  PT Long Term Goals - 10/05/20 1304       PT LONG TERM GOAL #1   Title The patient will be indep with HEP for core stability, flexibility, and LE strengthening.    Time 6    Period Weeks    Target Date 11/16/20      PT LONG TERM GOAL #2   Title The patient will improve functional status score from 50% up to 64% to demonstrate improved functional abilities.    Time 6    Period Weeks    Target Date 11/16/20      PT LONG TERM GOAL #3   Title The patient will improve hamstring length to -20 degrees bilaterally (90/90 supine starting position).    Time 6    Period Weeks    Target Date 11/16/20      PT LONG TERM GOAL #4   Title The patient will report resting back pain < or equal to 1/10.    Baseline 3/10    Time 6    Period Weeks    Target Date 11/16/20      PT LONG TERM GOAL #5   Title The patient will be able to tolerate >1 hour of standing activities with back pain < or equal to 2/10.    Time 6    Period Weeks    Target Date 11/16/20                   Plan - 11/03/20 0925     Clinical Impression Statement Pt with good response to addition of dead lift and wall squat  this session, continues wiht decreased mm length in bilat quads and increased mm spasticity in lumbar paraspinals    PT Next Visit Plan note for MD, update HEP    [place] and Agree with Plan of Care Patient             Patient will benefit from skilled therapeutic intervention in order to improve the following deficits and impairments:     Visit Diagnosis: Bilateral low back pain with sciatica, sciatica laterality unspecified, unspecified chronicity  Other symptoms and signs involving the musculoskeletal system     Problem List Patient Active Problem List   Diagnosis Date Noted   Aortic atherosclerosis ([place]) 09/21/2020   DDD (degenerative disc disease), lumbar 09/21/2020   OSA (obstructive sleep apnea) 09/16/2020   Sensorineural hearing loss (SNHL), bilateral 09/16/2020   Insomnia 09/16/2020   Primary osteoarthritis involving multiple joints 09/16/2020   Spondylolisthesis at L4-L5 level 09/16/2020   Anxiety 09/16/2020   Benign essential hypertension 08/02/2020   Prediabetes 11/17/2019   Combined forms of age-related cataract of left eye 07/24/2019   Personal history of pulmonary embolism 05/22/2018   Prostate cancer ([place]) 05/30/2016   Vitamin B 12 deficiency 10/23/2015   Diverticulosis of large intestine without hemorrhage 06/21/2015   Tubular adenoma of colon 06/21/2015   History of total knee arthroplasty, right 06/01/2014   Dyslipidemia 02/15/2014   ED (erectile dysfunction) 12/29/2011    Gillian Meeuwsen, PT 11/03/2020, 9:27 AM  [facility] [place], [place], [address] Phone: [phone]   Fax:  [phone]  Name: Monique Gift MRN: [id number] Date of Birth: [birth year]

## 2020-11-10 ENCOUNTER — Ambulatory Visit: Payer: Medicare Other | Admitting: Physical Therapy

## 2020-11-10 ENCOUNTER — Other Ambulatory Visit: Payer: Self-pay

## 2020-11-10 DIAGNOSIS — R29898 Other symptoms and signs involving the musculoskeletal system: Secondary | ICD-10-CM

## 2020-11-10 DIAGNOSIS — M5440 Lumbago with sciatica, unspecified side: Secondary | ICD-10-CM | POA: Diagnosis not present

## 2020-11-10 NOTE — Therapy (Signed)
[place], [place], [address] Phone: [phone]   Fax:  [phone]  Physical Therapy Treatment and Discharge  Patient Details  Name: Randall Hanson MRN: [id number] Date of Birth: 1950-05-07 Referring Provider (PT): Aundria Mems, MD   Encounter Date: 11/10/2020   PT End of Session - 11/10/20 0921     Visit Number 6    Number of Visits 12    Date for PT Re-Evaluation 11/16/20    Authorization Type UHC Medicare    Authorization - Number of Visits 6    Progress Note Due on Visit 10    PT Start Time 0845    PT Stop Time 0923    PT Time Calculation (min) 38 min    Activity Tolerance Patient tolerated treatment well    Behavior During Therapy [facility] for tasks assessed/performed             Past Medical History:  Diagnosis Date   Arthritis    Colon polyps    High cholesterol    Hypertension    Joint pain    Prostate cancer ([place])    Sleep apnea     Past Surgical History:  Procedure Laterality Date   PROSTATECTOMY     REPLACEMENT TOTAL KNEE     SHOULDER SURGERY      There were no vitals filed for this visit.   Subjective Assessment - 11/10/20 0851     Subjective Pt states "I can still feel it". He states he has had to shorten his walks with the dog and that he is only able to use the chainsaw for 2 hours before needing to stop due to pain.    Patient Stated Goals reduce pain    Currently in Pain? Yes    Pain Score 6     Pain Location Back    Pain Orientation Lower;Left    Pain Descriptors / Indicators Aching                [facility] PT Assessment - 11/10/20 0001       Assessment   Medical Diagnosis LBP radiating into L buttock, spondylolisthesis    Referring Provider (PT) Aundria Mems, MD    Onset Date/Surgical Date 09/30/20    Hand Dominance Right;Left    Prior Therapy none for LBP      Observation/Other Assessments   Focus on Therapeutic Outcomes  (FOTO)  52      AROM   Lumbar Flexion 10% limited    Lumbar Extension 50% limited    Lumbar - Right Side Bend 50% limited    Lumbar - Left Side Bend 50% limited    Lumbar - Right Rotation 10% limited    Lumbar - Left Rotation 10% limited      Flexibility   Hamstrings decreased 20 degrees bilat                           [facility] Adult PT Treatment/Exercise - 11/10/20 0001       Knee/Hip Exercises: Stretches   Active Hamstring Stretch Right;Left;2 reps;30 seconds    Quad Stretch Right;Left;2 reps;30 seconds    Hip Flexor Stretch Right;Left;2 reps;30 seconds      Knee/Hip Exercises: Aerobic   Tread Mill 1.7 mph x 4 min      Knee/Hip Exercises: Standing   Wall Squat 5 sets    Wall Squat Limitations with 10# KB held at 90 degrees  shoulder flexion 5 x 10sec    Other Standing Knee Exercises pallof press green TB x 10 bilat, dead lift 15# KB x 15 from floor    Other Standing Knee Exercises shoulder extension x 20 green TB                     PT Education - 11/10/20 0910     Education Details plan for d/c, updated HEP    Person(s) Educated Patient    Methods Explanation;Demonstration;Handout    Comprehension Returned demonstration;Verbalized understanding                 PT Long Term Goals - 11/10/20 0924       PT LONG TERM GOAL #1   Title The patient will be indep with HEP for core stability, flexibility, and LE strengthening.    Status Achieved      PT LONG TERM GOAL #2   Title The patient will improve functional status score from 50% up to 64% to demonstrate improved functional abilities.    Baseline 52 on 11/09/20    Status Not Met      PT LONG TERM GOAL #3   Title The patient will improve hamstring length to -20 degrees bilaterally (90/90 supine starting position).    Status Achieved      PT LONG TERM GOAL #4   Title The patient will report resting back pain < or equal to 1/10.    Baseline 5/10    Status Not Met      PT LONG  TERM GOAL #5   Title The patient will be able to tolerate >1 hour of standing activities with back pain < or equal to 2/10.    Baseline 7/10    Status Not Met                   Plan - 11/10/20 3383     Clinical Impression Statement Pt continues with significant pain with prolonged standing and walking. He has improved ROM and core strength but is limited in functional and recreational activity tolerance due to pain in back and Lt. hip. HEP updated and PT educated pt on importance of continuing stretching and core strengthening. Plan to d/c from PT and return to MD    PT Next Visit Plan d/c    PT [place] and Agree with Plan of Care Patient             Patient will benefit from skilled therapeutic intervention in order to improve the following deficits and impairments:     Visit Diagnosis: Bilateral low back pain with sciatica, sciatica laterality unspecified, unspecified chronicity  Other symptoms and signs involving the musculoskeletal system     Problem List Patient Active Problem List   Diagnosis Date Noted   Aortic atherosclerosis ([place]) 09/21/2020   DDD (degenerative disc disease), lumbar 09/21/2020   OSA (obstructive sleep apnea) 09/16/2020   Sensorineural hearing loss (SNHL), bilateral 09/16/2020   Insomnia 09/16/2020   Primary osteoarthritis involving multiple joints 09/16/2020   Spondylolisthesis at L4-L5 level 09/16/2020   Anxiety 09/16/2020   Benign essential hypertension 08/02/2020   Prediabetes 11/17/2019   Combined forms of age-related cataract of left eye 07/24/2019   Personal history of pulmonary embolism 05/22/2018   Prostate cancer ([place]) 05/30/2016   Vitamin B 12 deficiency 10/23/2015   Diverticulosis of large intestine without hemorrhage 06/21/2015   Tubular adenoma of colon 06/21/2015  History of total knee arthroplasty, right 06/01/2014   Dyslipidemia 02/15/2014   ED (erectile dysfunction) 12/29/2011    PHYSICAL THERAPY DISCHARGE SUMMARY  Visits from Start of Care: 6  Current functional level related to goals / functional outcomes: Improved ROM and core strength   Remaining deficits: pain   Education / Equipment: HEP   Patient agrees to discharge. Patient goals were partially met. Patient is being discharged due to the patient's request.  Isabelle Course, PT 11/10/2020, 9:25 AM  [facility] [place], [place], [address] Phone: [phone]   Fax:  [phone]  Name: Randall Hanson MRN: [id number] Date of Birth: Jan 11, 1951

## 2020-11-10 NOTE — Patient Instructions (Signed)
Access Code: [email or web address] URL: [email or web address] Date: 11/10/2020 Prepared by: Isabelle Course  Exercises Supine Hamstring Stretch with Strap - 1-2 x daily - 7 x weekly - 1 sets - 3 reps - 30 seconds hold Prone Quadriceps Stretch with Strap - 1 x daily - 7 x weekly - 3 sets - 1 reps - 20-30 sec hold Gastroc Stretch on Wall - 2 x daily - 7 x weekly - 1 sets - 3 reps - 30 seconds hold Seated Hip Flexor Stretch - 1 x daily - 7 x weekly - 3 sets - 1 reps - 20-30 sec hold Wall Quarter Squat - 2 x daily - 7 x weekly - 1 sets - 10 reps Standing Anti-Rotation Press with Anchored Resistance - 1 x daily - 7 x weekly - 2 sets - 10 reps Kettlebell Deadlift - 1 x daily - 7 x weekly - 2 sets - 10 reps

## 2020-11-11 ENCOUNTER — Ambulatory Visit (INDEPENDENT_AMBULATORY_CARE_PROVIDER_SITE_OTHER): Payer: Medicare Other | Admitting: Sports Medicine

## 2020-11-11 DIAGNOSIS — M4316 Spondylolisthesis, lumbar region: Secondary | ICD-10-CM

## 2020-11-11 MED ORDER — TRAMADOL HCL 50 MG PO TABS: 50.0000 mg | ORAL_TABLET | Freq: Three times a day (TID) | ORAL | 0 refills | Status: DC

## 2020-11-11 NOTE — Progress Notes (Signed)
    Procedures performed today:    None.  Independent interpretation of notes and tests performed by another provider:   None.  Brief History, Exam, Impression, and Recommendations:    Spondylolisthesis at L4-L5 level This is a very pleasant 70 year old male, he has chronic low back pain, radiation into the left buttock, he was seen in urgent care and had some steroids that helped dramatically, ultimately x-rays showed L4-L5 spondylolisthesis with L4-L5 facet arthritis, pain worse with standing and extension. Moderate gelling. I am inclined to think that his L4-L5 facets are the dominant pain generators, tramadol seems to control his pain to some degree and he has had now greater than 6 weeks of formal physical therapy. Due to persistent pain we will proceed with MRI for injection planning, likely bilateral L4-L5 facets, he is leaving for [facility] next Friday so we need to have all of the above done by Thursday of next week. Refilling tramadol for use 3 times daily and I will order his facet joint injections as soon as I see the MRI results.    ___________________________________________ Gwen Her. Dianah Field, M.D., ABFM., CAQSM. Primary Care and [place] Instructor of [place] of [employer]

## 2020-11-11 NOTE — Assessment & Plan Note (Signed)
This is a very pleasant 70 year old male, he has chronic low back pain, radiation into the left buttock, he was seen in urgent care and had some steroids that helped dramatically, ultimately x-rays showed L4-L5 spondylolisthesis with L4-L5 facet arthritis, pain worse with standing and extension. Moderate gelling. I am inclined to think that his L4-L5 facets are the dominant pain generators, tramadol seems to control his pain to some degree and he has had now greater than 6 weeks of formal physical therapy. Due to persistent pain we will proceed with MRI for injection planning, likely bilateral L4-L5 facets, he is leaving for [facility] next Friday so we need to have all of the above done by Thursday of next week. Refilling tramadol for use 3 times daily and I will order his facet joint injections as soon as I see the MRI results.

## 2020-11-14 ENCOUNTER — Other Ambulatory Visit: Payer: Self-pay

## 2020-11-14 ENCOUNTER — Ambulatory Visit (INDEPENDENT_AMBULATORY_CARE_PROVIDER_SITE_OTHER): Payer: Medicare Other

## 2020-11-14 DIAGNOSIS — M5441 Lumbago with sciatica, right side: Secondary | ICD-10-CM | POA: Diagnosis not present

## 2020-11-14 DIAGNOSIS — M5442 Lumbago with sciatica, left side: Secondary | ICD-10-CM | POA: Diagnosis not present

## 2020-11-14 DIAGNOSIS — M4316 Spondylolisthesis, lumbar region: Secondary | ICD-10-CM

## 2020-11-15 DIAGNOSIS — M4316 Spondylolisthesis, lumbar region: Secondary | ICD-10-CM

## 2020-11-16 NOTE — Telephone Encounter (Signed)
Called Juliann Pulse at  [facility] and left detailed message on secure vm regarding getting patient scheduled for injections.

## 2020-11-16 NOTE — Telephone Encounter (Signed)
Ok please call [facility] and see if he can get in this week.

## 2020-11-17 MED ORDER — HYDROCODONE-ACETAMINOPHEN 5-325 MG PO TABS: 1.0000 | ORAL_TABLET | Freq: Three times a day (TID) | ORAL | 0 refills | Status: DC | PRN

## 2020-11-19 ENCOUNTER — Other Ambulatory Visit: Payer: Self-pay | Admitting: Sports Medicine

## 2020-11-19 ENCOUNTER — Ambulatory Visit
Admission: RE | Admit: 2020-11-19 | Discharge: 2020-11-19 | Disposition: A | Discharge status: Home / Self Care | Payer: Medicare Other | Source: Ambulatory Visit | Attending: Sports Medicine | Admitting: Sports Medicine

## 2020-11-19 ENCOUNTER — Other Ambulatory Visit: Payer: Self-pay

## 2020-11-19 DIAGNOSIS — M4316 Spondylolisthesis, lumbar region: Secondary | ICD-10-CM

## 2020-11-19 MED ORDER — METHYLPREDNISOLONE ACETATE 40 MG/ML INJ SUSP (RADIOLOG
120.0000 mg | Freq: Once | INTRAMUSCULAR | Status: AC
  Administered 2020-11-19: 120 mg via INTRA_ARTICULAR

## 2020-11-19 NOTE — Discharge Instructions (Signed)

## 2020-12-10 ENCOUNTER — Other Ambulatory Visit: Payer: Self-pay | Admitting: Physician Assistant

## 2020-12-10 DIAGNOSIS — M5442 Lumbago with sciatica, left side: Secondary | ICD-10-CM

## 2020-12-10 DIAGNOSIS — M159 Polyosteoarthritis, unspecified: Secondary | ICD-10-CM

## 2020-12-16 ENCOUNTER — Ambulatory Visit: Payer: Medicare Other | Admitting: Sports Medicine

## 2020-12-17 ENCOUNTER — Ambulatory Visit: Payer: Medicare Other | Admitting: Physician Assistant

## 2020-12-17 ENCOUNTER — Ambulatory Visit (INDEPENDENT_AMBULATORY_CARE_PROVIDER_SITE_OTHER): Payer: Medicare Other | Admitting: Sports Medicine

## 2020-12-17 ENCOUNTER — Other Ambulatory Visit: Payer: Self-pay

## 2020-12-17 ENCOUNTER — Encounter: Payer: Self-pay | Admitting: Physician Assistant

## 2020-12-17 VITALS — BP 140/76 | HR 70 | Temp 97.5°F | Ht 70.0 in | Wt 209.0 lb

## 2020-12-17 DIAGNOSIS — I10 Essential (primary) hypertension: Secondary | ICD-10-CM

## 2020-12-17 DIAGNOSIS — Z1159 Encounter for screening for other viral diseases: Secondary | ICD-10-CM

## 2020-12-17 DIAGNOSIS — G4700 Insomnia, unspecified: Secondary | ICD-10-CM

## 2020-12-17 DIAGNOSIS — M4316 Spondylolisthesis, lumbar region: Secondary | ICD-10-CM | POA: Diagnosis not present

## 2020-12-17 DIAGNOSIS — F419 Anxiety disorder, unspecified: Secondary | ICD-10-CM

## 2020-12-17 DIAGNOSIS — E785 Hyperlipidemia, unspecified: Secondary | ICD-10-CM

## 2020-12-17 MED ORDER — TRAMADOL HCL 50 MG PO TABS: 50.0000 mg | ORAL_TABLET | Freq: Two times a day (BID) | ORAL | 3 refills | Status: DC | PRN

## 2020-12-17 MED ORDER — CLONAZEPAM 0.5 MG PO TABS
0.5000 mg | ORAL_TABLET | Freq: Two times a day (BID) | ORAL | 1 refills | Status: DC | PRN
Start: 2020-12-17 — End: 2021-03-01

## 2020-12-17 MED ORDER — LISINOPRIL 10 MG PO TABS: 10.0000 mg | ORAL_TABLET | Freq: Every day | ORAL | 1 refills | Status: DC

## 2020-12-17 MED ORDER — MIRTAZAPINE 7.5 MG PO TABS: 7.5000 mg | ORAL_TABLET | Freq: Every day | ORAL | 2 refills | Status: DC

## 2020-12-17 NOTE — Progress Notes (Signed)
    Procedures performed today:    None.  Independent interpretation of notes and tests performed by another provider:   None.  Brief History, Exam, Impression, and Recommendations:    Spondylolisthesis at L4-L5 level Randall Hanson returns, he is a very pleasant 70 year old male, chronic low back pain with radiation to the left buttock, right side though as well, he went to urgent care and had some steroids, ultimately x-rays showed L4-L5 spondylolisthesis with L4-L5 facet arthritis, moderate gelling in the back, at the last visit we had suspected that the majority of his pain was coming from the facet joints, so we set him up with an MRI and then bilateral L4-S1 facet joint injections, he reported complete pain relief after the injection, and fantastic pain relief now, uses tramadol only 1-2 times a day, feels really good. I did discuss some anticipatory guidance should his pain return, we also discussed medial branch blocks and facet radiofrequency ablation as opposed to additional facet joint injections. Refilling tramadol, return as needed.  Chronic process not at goal with pharmacologic intervention.  ___________________________________________ Gwen Her. Dianah Field, M.D., ABFM., CAQSM. Primary Care and [place] Instructor of [place] of [facility] [employer] [facility]

## 2020-12-17 NOTE — Assessment & Plan Note (Signed)
Trooper returns, he is a very pleasant 70 year old male, chronic low back pain with radiation to the left buttock, right side though as well, he went to urgent care and had some steroids, ultimately x-rays showed L4-L5 spondylolisthesis with L4-L5 facet arthritis, moderate gelling in the back, at the last visit we had suspected that the majority of his pain was coming from the facet joints, so we set him up with an MRI and then bilateral L4-S1 facet joint injections, he reported complete pain relief after the injection, and fantastic pain relief now, uses tramadol only 1-2 times a day, feels really good. I did discuss some anticipatory guidance should his pain return, we also discussed medial branch blocks and facet radiofrequency ablation as opposed to additional facet joint injections. Refilling tramadol, return as needed.

## 2020-12-17 NOTE — Patient Instructions (Signed)
Managing Anxiety, Adult ?After being diagnosed with anxiety, you may be relieved to know why you have felt or behaved a certain way. You may also feel overwhelmed about the treatment ahead and what it will mean for your life. With care and support, you can manage this condition. ?How to manage lifestyle changes ?Managing stress and anxiety ?Stress is your body's reaction to life changes and events, both good and bad. Most stress will last just a few hours, but stress can be ongoing and can lead to more than just stress. Although stress can play a major role in anxiety, it is not the same as anxiety. Stress is usually caused by something external, such as a deadline, test, or competition. Stress normally passes after the triggering event has ended.  ?Anxiety is caused by something internal, such as imagining a terrible outcome or worrying that something will go wrong that will devastate you. Anxiety often does not go away even after the triggering event is over, and it can become long-term (chronic) worry. It is important to understand the differences between stress and anxiety and to manage your stress effectively so that it does not lead to an anxious response. ?Talk with your health care provider or a counselor to learn more about reducing anxiety and stress. He or she may suggest tension reduction techniques, such as: ?Music therapy. Spend time creating or listening to music that you enjoy and that inspires you. ?Mindfulness-based meditation. Practice being aware of your normal breaths while not trying to control your breathing. It can be done while sitting or walking. ?Centering prayer. This involves focusing on a word, phrase, or sacred image that means something to you and brings you peace. ?Deep breathing. To do this, expand your stomach and inhale slowly through your nose. Hold your breath for 3-5 seconds. Then exhale slowly, letting your stomach muscles relax. ?Self-talk. Learn to notice and identify  thought patterns that lead to anxiety reactions and change those patterns to thoughts that feel peaceful. ?Muscle relaxation. Taking time to tense muscles and then relax them. ?Choose a tension reduction technique that fits your lifestyle and personality. These techniques take time and practice. Set aside 5-15 minutes a day to do them. Therapists can offer counseling and training in these techniques. The training to help with anxiety may be covered by some insurance plans. ?Other things you can do to manage stress and anxiety include: ?Keeping a stress diary. This can help you learn what triggers your reaction and then learn ways to manage your response. ?Thinking about how you react to certain situations. You may not be able to control everything, but you can control your response. ?Making time for activities that help you relax and not feeling guilty about spending your time in this way. ?Doing visual imagery. This involves imagining or creating mental pictures to help you relax. ?Practicing yoga. Through yoga poses, you can lower tension and promote relaxation. ? ?Medicines ?Medicines can help ease symptoms. Medicines for anxiety include: ?Antidepressant medicines. These are usually prescribed for long-term daily control. ?Anti-anxiety medicines. These may be added in severe cases, especially when panic attacks occur. ?Medicines will be prescribed by a health care provider. When used together, medicines, psychotherapy, and tension reduction techniques may be the most effective treatment. ?Relationships ?Relationships can play a big part in helping you recover. Try to spend more time connecting with trusted friends and family members. ?Consider going to couples counseling if you have a partner, taking family education classes, or going to family   therapy. ?Therapy can help you and others better understand your condition. ?How to recognize changes in your anxiety ?Everyone responds differently to treatment for  anxiety. Recovery from anxiety happens when symptoms decrease and stop interfering with your daily activities at home or work. This may mean that you will start to: ?Have better concentration and focus. Worry will interfere less in your daily thinking. ?Sleep better. ?Be less irritable. ?Have more energy. ?Have improved memory. ?It is also important to recognize when your condition is getting worse. Contact your health care provider if your symptoms interfere with home or work and you feel like your condition is not improving. ?Follow these instructions at home: ?Activity ?Exercise. Adults should do the following: ?Exercise for at least 150 minutes each week. The exercise should increase your heart rate and make you sweat (moderate-intensity exercise). ?Strengthening exercises at least twice a week. ?Get the right amount and quality of sleep. Most adults need 7-9 hours of sleep each night. ?Lifestyle ? ?Eat a healthy diet that includes plenty of vegetables, fruits, whole grains, low-fat dairy products, and lean protein. ?Do not eat a lot of foods that are high in fats, added sugars, or salt (sodium). ?Make choices that simplify your life. ?Do not use any products that contain nicotine or tobacco. These products include cigarettes, chewing tobacco, and vaping devices, such as e-cigarettes. If you need help quitting, ask your health care provider. ?Avoid caffeine, alcohol, and certain over-the-counter cold medicines. These may make you feel worse. Ask your pharmacist which medicines to avoid. ?General instructions ?Take over-the-counter and prescription medicines only as told by your health care provider. ?Keep all follow-up visits. This is important. ?Where to find support ?You can get help and support from these sources: ?Self-help groups. ?Online and community organizations. ?A trusted spiritual leader. ?Couples counseling. ?Family education classes. ?Family therapy. ?Where to find more information ?You may find  that joining a support group helps you deal with your anxiety. The following sources can help you locate counselors or support groups near you: ?Mental Health America: www.mentalhealthamerica.net ?Anxiety and Depression Association of America (ADAA): www.adaa.org ?National Alliance on Mental Illness (NAMI): www.nami.org ?Contact a health care provider if: ?You have a hard time staying focused or finishing daily tasks. ?You spend many hours a day feeling worried about everyday life. ?You become exhausted by worry. ?You start to have headaches or frequently feel tense. ?You develop chronic nausea or diarrhea. ?Get help right away if: ?You have a racing heart and shortness of breath. ?You have thoughts of hurting yourself or others. ?If you ever feel like you may hurt yourself or others, or have thoughts about taking your own life, get help right away. Go to your nearest emergency department or: ?Call your local emergency services (911 in the [place] ?Call a suicide crisis helpline, such as the National Suicide Prevention Lifeline at 1-800-273-8255 or 988 in the [place] This is open 24 hours a day in the [place] ?Text the Crisis Text Line at 741741 (in the [place] ?Summary ?Taking steps to learn and use tension reduction techniques can help calm you and help prevent triggering an anxiety reaction. ?When used together, medicines, psychotherapy, and tension reduction techniques may be the most effective treatment. ?Family, friends, and partners can play a big part in supporting you. ?This information is not intended to replace advice given to you by your health care provider. Make sure you discuss any questions you have with your health care provider. ?Document Revised: 07/28/2020 Document Reviewed: 04/25/2020 ?Elsevier Patient   Education ? 2022 Elsevier Inc. ? ?

## 2020-12-17 NOTE — Progress Notes (Signed)
Subjective:    Patient ID: Randall Hanson, male    DOB: December 09, 1950, 70 y.o.   MRN: [id number]  HPI Pt is a 70 yo male who presents to the clinic with his wife for follow up.   Belsomra not helping him sleep at all and very expensive per patient and hard to get. He would lie to try something else.   No CP, palpitations, headaches, vision changes. Taking lisinopril and no concerns.   His anxiety is still an issue. He is easily overwhelmed.    .. Active Ambulatory Problems    Diagnosis Date Noted   Benign essential hypertension 08/02/2020   Diverticulosis of large intestine without hemorrhage 06/21/2015   Dyslipidemia 02/15/2014   ED (erectile dysfunction) 12/29/2011   History of total knee arthroplasty, right 06/01/2014   OSA (obstructive sleep apnea) 09/16/2020   Personal history of pulmonary embolism 05/22/2018   Prediabetes 11/17/2019   Prostate cancer ([place]) 05/30/2016   Sensorineural hearing loss (SNHL), bilateral 09/16/2020   Tubular adenoma of colon 06/21/2015   Vitamin B 12 deficiency 10/23/2015   Combined forms of age-related cataract of left eye 07/24/2019   Insomnia 09/16/2020   Primary osteoarthritis involving multiple joints 09/16/2020   Spondylolisthesis at L4-L5 level 09/16/2020   Anxiety 09/16/2020   Aortic atherosclerosis ([place]) 09/21/2020   Resolved Ambulatory Problems    Diagnosis Date Noted   DDD (degenerative disc disease), lumbar 09/21/2020   Past Medical History:  Diagnosis Date   Arthritis    Colon polyps    High cholesterol    Hypertension    Joint pain    Sleep apnea     Review of Systems See HPI.     Objective:   Physical Exam Vitals reviewed.  Constitutional:      Appearance: Normal appearance. He is obese.  HENT:     Head: Normocephalic.  Cardiovascular:     Rate and Rhythm: Normal rate and regular rhythm.     Pulses: Normal pulses.     Heart sounds: Normal heart sounds.  Pulmonary:     Effort: Pulmonary effort is normal.      Breath sounds: Normal breath sounds.  Neurological:     General: No focal deficit present.     Mental Status: He is alert and oriented to person, place, and time.  Psychiatric:        Mood and Affect: Mood normal.      .. Depression screen [facility] 2/9 12/17/2020 09/16/2020  Decreased Interest 0 0  Down, Depressed, Hopeless 0 0  PHQ - 2 Score 0 0  Altered sleeping 2 1  Tired, decreased energy 1 1  Change in appetite 0 0  Feeling bad or failure about yourself  0 0  Trouble concentrating 0 0  Moving slowly or fidgety/restless 1 0  Suicidal thoughts 0 0  PHQ-9 Score 4 2  Difficult doing work/chores Somewhat difficult Not difficult at all   .Marland Kitchen GAD 7 : Generalized Anxiety Score 12/17/2020 09/16/2020  Nervous, Anxious, on Edge 1 0  Control/stop worrying 1 1  Worry too much - different things 1 1  Trouble relaxing 1 1  Restless 0 0  Easily annoyed or irritable 1 1  Afraid - awful might happen 0 0  Total GAD 7 Score 5 4  Anxiety Difficulty Somewhat difficult Not difficult at all        Assessment & Plan:  Marland KitchenMarland KitchenJarell was seen today for hypertension, insomnia and anxiety.  Diagnoses and all orders for this  visit:  Benign essential hypertension -     COMPLETE METABOLIC PANEL WITH GFR -     lisinopril (ZESTRIL) 10 MG tablet; Take 1 tablet (10 mg total) by mouth daily.  Anxiety -     clonazePAM (KLONOPIN) 0.5 MG tablet; Take 1 tablet (0.5 mg total) by mouth 2 (two) times daily as needed for anxiety. -     B12 and Folate Panel -     VITAMIN D 25 Hydroxy (Vit-D Deficiency, Fractures)  Insomnia, unspecified type -     mirtazapine (REMERON) 7.5 MG tablet; Take 1 tablet (7.5 mg total) by mouth at bedtime.  Need for hepatitis C screening test -     Hepatitis C antibody  Dyslipidemia -     Lipid Panel w/reflex Direct LDL   Will try remeron for sleep and could help with anxiety/mood as well.  Discussed anxiety and coping skills. Request trial of clonazempam because his wife is on  it.  Sleeping better could help overall anxiety  Strongly consider counseling.  Discussed daily usage is not the goal. Ok to use as needed for acute worsening anxiety.  BP to goal. Refilled lisinopril.  Screening labs ordered.  Follow up in 2 months.

## 2020-12-20 ENCOUNTER — Encounter: Payer: Self-pay | Admitting: Physician Assistant

## 2020-12-21 LAB — LIPID PANEL W/REFLEX DIRECT LDL
Cholesterol: 131 mg/dL (ref <200)
HDL: 41 mg/dL (ref >=40)
LDL Cholesterol (Calc): 76 mg/dL (calc)
Non-HDL Cholesterol (Calc): 90 mg/dL (calc) (ref <130)
Total CHOL/HDL Ratio: 3.2 (calc) (ref <5.0)
Triglycerides: 68 mg/dL (ref <150)

## 2020-12-21 LAB — B12 AND FOLATE PANEL
Folate: 10.4 ng/mL
Vitamin B-12: 1084 pg/mL (ref 200–1100)

## 2020-12-21 LAB — COMPLETE METABOLIC PANEL WITH GFR
AG Ratio: 1.9 (calc) (ref 1.0–2.5)
ALT: 10 U/L (ref 9–46)
AST: 15 U/L (ref 10–35)
Albumin: 4 g/dL (ref 3.6–5.1)
Alkaline phosphatase (APISO): 35 U/L (ref 35–144)
BUN: 23 mg/dL (ref 7–25)
CO2: 31 mmol/L (ref 20–32)
Calcium: 9 mg/dL (ref 8.6–10.3)
Chloride: 105 mmol/L (ref 98–110)
Creat: 1.04 mg/dL (ref 0.70–1.35)
Globulin: 2.1 g/dL (calc) (ref 1.9–3.7)
Glucose, Bld: 85 mg/dL (ref 65–99)
Potassium: 4.1 mmol/L (ref 3.5–5.3)
Sodium: 141 mmol/L (ref 135–146)
Total Bilirubin: 0.8 mg/dL (ref 0.2–1.2)
Total Protein: 6.1 g/dL (ref 6.1–8.1)
eGFR: 78 mL/min/{1.73_m2} (ref >=60)

## 2020-12-21 LAB — HEPATITIS C ANTIBODY
Hepatitis C Ab: NONREACTIVE
SIGNAL TO CUT-OFF: 0.1 (ref <1.00)

## 2020-12-21 LAB — VITAMIN D 25 HYDROXY (VIT D DEFICIENCY, FRACTURES): Vit D, 25-Hydroxy: 26 ng/mL — ABNORMAL LOW (ref 30–100)

## 2020-12-22 ENCOUNTER — Encounter: Payer: Self-pay | Admitting: Physician Assistant

## 2020-12-22 DIAGNOSIS — E559 Vitamin D deficiency, unspecified: Secondary | ICD-10-CM | POA: Insufficient documentation

## 2020-12-22 NOTE — Progress Notes (Signed)
Randall Hanson,   Cholesterol looks great.  Kidney, liver, glucose look wonderful.  B12 and folate look great.  Vitamin D is low. Take at least 2000 units daily with protein/fat.

## 2021-02-23 ENCOUNTER — Other Ambulatory Visit: Payer: Self-pay

## 2021-02-23 ENCOUNTER — Ambulatory Visit (INDEPENDENT_AMBULATORY_CARE_PROVIDER_SITE_OTHER): Payer: Medicare Other | Admitting: Physician Assistant

## 2021-02-23 ENCOUNTER — Encounter: Payer: Self-pay | Admitting: Physician Assistant

## 2021-02-23 VITALS — BP 157/75 | HR 76 | Ht 70.0 in | Wt 212.0 lb

## 2021-02-23 DIAGNOSIS — M25552 Pain in left hip: Secondary | ICD-10-CM | POA: Insufficient documentation

## 2021-02-23 DIAGNOSIS — R4589 Other symptoms and signs involving emotional state: Secondary | ICD-10-CM | POA: Insufficient documentation

## 2021-02-23 DIAGNOSIS — G894 Chronic pain syndrome: Secondary | ICD-10-CM | POA: Diagnosis not present

## 2021-02-23 DIAGNOSIS — G4700 Insomnia, unspecified: Secondary | ICD-10-CM

## 2021-02-23 DIAGNOSIS — M4316 Spondylolisthesis, lumbar region: Secondary | ICD-10-CM

## 2021-02-23 MED ORDER — DULOXETINE HCL 30 MG PO CPEP: 30.0000 mg | ORAL_CAPSULE | Freq: Every day | ORAL | 2 refills | Status: DC

## 2021-02-23 MED ORDER — ZOLPIDEM TARTRATE 5 MG PO TABS: 5.0000 mg | ORAL_TABLET | Freq: Every day | ORAL | 2 refills | Status: DC

## 2021-02-23 NOTE — Patient Instructions (Signed)
Start checking BP at home and keep log.  Ambien for sleep. Take 30 minutes before bedtime.  Cymbalta for mood and pain.

## 2021-02-23 NOTE — Progress Notes (Signed)
Subjective:    Patient ID: Randall Hanson, male    DOB: Nov 17, 1950, 71 y.o.   MRN: [id number]  HPI Pt is a 71 yo male who presents to the clinic to follow up on insomnia.   Not taking remeron because it made him not feel good. He failed belsomra and trazodone as well. Sleeping 4 hours at night. Trouble going and staying asleep.   His low back pain is much better after injections but now having more left groin pain. Worse at night and when really active. Taking mobic daily. PT for low back did not help that much. No imaging done of left him only back. He is very active. Pain at night and during the day worsens his mood. Reports depressed feelings. No suicidal thoughts. Ready to start medication. Other than pain not being able to get erection and have sex is effecting his mood too. After his prostate cancer his mood has gone down hill.    .. Active Ambulatory Problems    Diagnosis Date Noted   Benign essential hypertension 08/02/2020   Diverticulosis of large intestine without hemorrhage 06/21/2015   Dyslipidemia 02/15/2014   ED (erectile dysfunction) 12/29/2011   History of total knee arthroplasty, right 06/01/2014   OSA (obstructive sleep apnea) 09/16/2020   Personal history of pulmonary embolism 05/22/2018   Prediabetes 11/17/2019   Prostate cancer ([place]) 05/30/2016   Sensorineural hearing loss (SNHL), bilateral 09/16/2020   Tubular adenoma of colon 06/21/2015   Vitamin B 12 deficiency 10/23/2015   Combined forms of age-related cataract of left eye 07/24/2019   Insomnia 09/16/2020   Primary osteoarthritis involving multiple joints 09/16/2020   Spondylolisthesis at L4-L5 level 09/16/2020   Anxiety 09/16/2020   Aortic atherosclerosis ([place]) 09/21/2020   Vitamin D insufficiency 12/22/2020   Depressed mood 02/23/2021   Chronic pain syndrome 02/23/2021   Left hip pain 02/23/2021   Resolved Ambulatory Problems    Diagnosis Date Noted   DDD (degenerative disc disease), lumbar  09/21/2020   Past Medical History:  Diagnosis Date   Arthritis    Colon polyps    High cholesterol    Hypertension    Joint pain    Sleep apnea       Review of Systems See HPI.     Objective:   Physical Exam Vitals reviewed.  Constitutional:      Appearance: Normal appearance. He is obese.  HENT:     Head: Normocephalic.  Cardiovascular:     Rate and Rhythm: Normal rate and regular rhythm.     Pulses: Normal pulses.  Pulmonary:     Effort: Pulmonary effort is normal.     Breath sounds: Normal breath sounds.  Musculoskeletal:     Right lower leg: No edema.     Left lower leg: No edema.     Comments: Lower ext strength 5/5.  No tenderness over lateral greater trochanter of left hip.   Neurological:     General: No focal deficit present.     Mental Status: He is alert and oriented to person, place, and time.  Psychiatric:        Mood and Affect: Mood normal.          Assessment & Plan:  Marland KitchenMarland KitchenDerryck was seen today for anxiety and insomnia.  Diagnoses and all orders for this visit:  Chronic pain syndrome -     DULoxetine (CYMBALTA) 30 MG capsule; Take 1 capsule (30 mg total) by mouth daily.  Depressed mood -  DULoxetine (CYMBALTA) 30 MG capsule; Take 1 capsule (30 mg total) by mouth daily.  Left hip pain  Insomnia, unspecified type -     zolpidem (AMBIEN) 5 MG tablet; Take 1 tablet (5 mg total) by mouth at bedtime.  Spondylolisthesis at L4-L5 level   BP elevated. He did take lisinopril last night.  Start checking BP at home.  Follow up in 3 months.   For sleep failed belsomra/remeron/trazodone Will try ambien.   PHQ/GAD not to goal Declined counseling Start cymbalta for mood and pain. Follow up in 3 months.  ?left hip pain and possible not all his low back. Consider follow up with Dr. Darene Lamer to evaluate for left hip arthritis.

## 2021-03-01 ENCOUNTER — Other Ambulatory Visit: Payer: Self-pay | Admitting: Physician Assistant

## 2021-03-01 DIAGNOSIS — F419 Anxiety disorder, unspecified: Secondary | ICD-10-CM

## 2021-03-01 NOTE — Telephone Encounter (Signed)
Last written 12/17/2020 #60 with 1 refill Last appt 02/23/2021

## 2021-03-07 ENCOUNTER — Other Ambulatory Visit: Payer: Self-pay | Admitting: Physician Assistant

## 2021-03-07 DIAGNOSIS — G4700 Insomnia, unspecified: Secondary | ICD-10-CM

## 2021-03-11 ENCOUNTER — Other Ambulatory Visit: Payer: Self-pay | Admitting: Physician Assistant

## 2021-03-11 DIAGNOSIS — M159 Polyosteoarthritis, unspecified: Secondary | ICD-10-CM

## 2021-03-11 DIAGNOSIS — M5442 Lumbago with sciatica, left side: Secondary | ICD-10-CM

## 2021-04-10 ENCOUNTER — Other Ambulatory Visit: Payer: Self-pay | Admitting: Sports Medicine

## 2021-04-10 DIAGNOSIS — M4316 Spondylolisthesis, lumbar region: Secondary | ICD-10-CM

## 2021-04-25 ENCOUNTER — Encounter: Payer: Self-pay | Admitting: Sports Medicine

## 2021-04-25 DIAGNOSIS — M47816 Spondylosis without myelopathy or radiculopathy, lumbar region: Secondary | ICD-10-CM

## 2021-04-25 NOTE — Assessment & Plan Note (Signed)
Good response to bilateral L4-S1 facet joint injections, repeating these injections per his request, I did bring up medial branch blocks and RFA with him. ?

## 2021-05-12 ENCOUNTER — Other Ambulatory Visit: Payer: Self-pay | Admitting: Physician Assistant

## 2021-05-12 DIAGNOSIS — G4700 Insomnia, unspecified: Secondary | ICD-10-CM

## 2021-05-12 NOTE — Telephone Encounter (Signed)
Last written 02/23/2021 #30 with 2 refills ?Last appt 02/23/2021 ?

## 2021-05-14 ENCOUNTER — Other Ambulatory Visit: Payer: Self-pay | Admitting: Physician Assistant

## 2021-05-14 DIAGNOSIS — R4589 Other symptoms and signs involving emotional state: Secondary | ICD-10-CM

## 2021-05-14 DIAGNOSIS — G894 Chronic pain syndrome: Secondary | ICD-10-CM

## 2021-05-23 ENCOUNTER — Ambulatory Visit (INDEPENDENT_AMBULATORY_CARE_PROVIDER_SITE_OTHER): Payer: Medicare Other | Admitting: Physician Assistant

## 2021-05-23 ENCOUNTER — Encounter: Payer: Self-pay | Admitting: Physician Assistant

## 2021-05-23 VITALS — BP 156/74 | HR 76 | Ht 70.0 in | Wt 211.0 lb

## 2021-05-23 DIAGNOSIS — G4700 Insomnia, unspecified: Secondary | ICD-10-CM

## 2021-05-23 DIAGNOSIS — G894 Chronic pain syndrome: Secondary | ICD-10-CM

## 2021-05-23 DIAGNOSIS — I10 Essential (primary) hypertension: Secondary | ICD-10-CM | POA: Diagnosis not present

## 2021-05-23 DIAGNOSIS — R4589 Other symptoms and signs involving emotional state: Secondary | ICD-10-CM

## 2021-05-23 DIAGNOSIS — E785 Hyperlipidemia, unspecified: Secondary | ICD-10-CM

## 2021-05-23 DIAGNOSIS — M47816 Spondylosis without myelopathy or radiculopathy, lumbar region: Secondary | ICD-10-CM

## 2021-05-23 MED ORDER — ZOLPIDEM TARTRATE 10 MG PO TABS: 10.0000 mg | ORAL_TABLET | Freq: Every evening | ORAL | 0 refills | Status: DC | PRN

## 2021-05-23 MED ORDER — DULOXETINE HCL 30 MG PO CPEP: 30.0000 mg | ORAL_CAPSULE | Freq: Every day | ORAL | 3 refills | Status: DC

## 2021-05-23 NOTE — Progress Notes (Signed)
? ?Established Patient Office Visit ? ?Subjective   ?Patient ID: Randall Hanson, male    DOB: [birth year]  Age: 71 y.o. MRN: [id number] ? ?Chief Complaint  ?Patient presents with  ? Follow-up  ?  0  ? ? ?HPI ?Pt is a 71 yo male who presents to the clinic to follow up on medications.  ? ?He is checking his BP at home and has been ranging 140/70s. No CP, palpitations, headaches or vision changes. He is taking lisinopril '10mg'$  daily.  ? ?Continues to have problems sleeping. He gets to sleep with Lorrin Mais but wakes up a few hours later and many times he has to take klonapin to get back to sleep. Failed remeron and trazodone.  ? ?Overall body pain does seem to be better and mood is better on cymbalta. He seems like he is less bothered. He is not doing as much heavy lifting but is trying to stay active in the yard/house work. Denies any increase in anxiety.  ? ? ?.. ?Active Ambulatory Problems  ?  Diagnosis Date Noted  ? Benign essential hypertension 08/02/2020  ? Diverticulosis of large intestine without hemorrhage 06/21/2015  ? Dyslipidemia 02/15/2014  ? ED (erectile dysfunction) 12/29/2011  ? History of total knee arthroplasty, right 06/01/2014  ? OSA (obstructive sleep apnea) 09/16/2020  ? Personal history of pulmonary embolism 05/22/2018  ? Prediabetes 11/17/2019  ? Prostate cancer ([place]) 05/30/2016  ? Sensorineural hearing loss (SNHL), bilateral 09/16/2020  ? Tubular adenoma of colon 06/21/2015  ? Vitamin B 12 deficiency 10/23/2015  ? Combined forms of age-related cataract of left eye 07/24/2019  ? Insomnia 09/16/2020  ? Primary osteoarthritis involving multiple joints 09/16/2020  ? Lumbar spondylosis 09/16/2020  ? Anxiety 09/16/2020  ? Aortic atherosclerosis ([place]) 09/21/2020  ? Vitamin D insufficiency 12/22/2020  ? Depressed mood 02/23/2021  ? Chronic pain syndrome 02/23/2021  ? Left hip pain 02/23/2021  ? ?Resolved Ambulatory Problems  ?  Diagnosis Date Noted  ? DDD (degenerative disc disease), lumbar 09/21/2020   ? ?Past Medical History:  ?Diagnosis Date  ? Arthritis   ? Colon polyps   ? High cholesterol   ? Hypertension   ? Joint pain   ? Sleep apnea   ? ? ? ?ROS ?See HPI.  ?  ?Objective:  ?  ? ?BP (!) 156/74   Pulse 76   Ht '5\' 10"'$  (1.778 m)   Wt 211 lb (95.7 kg)   SpO2 99%   BMI 30.28 kg/m?  ?BP Readings from Last 3 Encounters:  ?05/23/21 (!) 156/74  ?02/23/21 (!) 157/75  ?12/17/20 140/76  ? ?Wt Readings from Last 3 Encounters:  ?05/23/21 211 lb (95.7 kg)  ?02/23/21 212 lb (96.2 kg)  ?12/17/20 209 lb (94.8 kg)  ? ?  ? ?Physical Exam ?Vitals reviewed.  ?Constitutional:   ?   Appearance: Normal appearance. He is obese.  ?HENT:  ?   Head: Normocephalic.  ?Neck:  ?   Vascular: No carotid bruit.  ?Cardiovascular:  ?   Rate and Rhythm: Normal rate and regular rhythm.  ?   Pulses: Normal pulses.  ?   Heart sounds: Normal heart sounds. No murmur heard. ?  No friction rub.  ?Pulmonary:  ?   Effort: Pulmonary effort is normal.  ?   Breath sounds: Normal breath sounds.  ?Abdominal:  ?   General: Bowel sounds are normal.  ?   Palpations: Abdomen is soft.  ?Lymphadenopathy:  ?   Cervical: No cervical adenopathy.  ?Neurological:  ?  General: No focal deficit present.  ?   Mental Status: He is alert and oriented to person, place, and time.  ?Psychiatric:     ?   Mood and Affect: Mood normal.  ? ? ? ?The 10-year ASCVD risk score (Arnett DK, et al., 2019) is: 20.3% ? ?  ?Assessment & Plan:  ?..Alvester was seen today for follow-up. ? ?Diagnoses and all orders for this visit: ? ?Benign essential hypertension ? ?Chronic pain syndrome ?-     DULoxetine (CYMBALTA) 30 MG capsule; Take 1 capsule (30 mg total) by mouth daily. ? ?Depressed mood ?-     DULoxetine (CYMBALTA) 30 MG capsule; Take 1 capsule (30 mg total) by mouth daily. ? ?Insomnia, unspecified type ?-     zolpidem (AMBIEN) 10 MG tablet; Take 1 tablet (10 mg total) by mouth at bedtime as needed for sleep. ? ?Lumbar spondylosis ?-     DULoxetine (CYMBALTA) 30 MG capsule; Take 1  capsule (30 mg total) by mouth daily. ? ?Dyslipidemia ? ? ?BP not to goal. Increased lisinopril to '20mg'$  daily.  ? ?Continue cymbalta for mood and pain ? ?Increased ambien to '10mg'$  at bedtime.  ? ?Follow up in 3 months, continue to keep BP log.  ? ?Iran Planas, PA-C ? ?

## 2021-05-23 NOTE — Patient Instructions (Addendum)
Lisinopril for blood pressure increase to '20mg'$  ?Continue cymbalta daily.  ?Increase ambien to '10mg'$  if still not working try 12.'5mg'$ .  ?

## 2021-06-08 ENCOUNTER — Encounter: Payer: Self-pay | Admitting: Physician Assistant

## 2021-06-08 ENCOUNTER — Encounter: Payer: Self-pay | Admitting: Sports Medicine

## 2021-06-08 DIAGNOSIS — G4700 Insomnia, unspecified: Secondary | ICD-10-CM

## 2021-06-09 ENCOUNTER — Other Ambulatory Visit: Payer: Self-pay | Admitting: Physician Assistant

## 2021-06-09 DIAGNOSIS — M5442 Lumbago with sciatica, left side: Secondary | ICD-10-CM

## 2021-06-09 DIAGNOSIS — M150 Primary generalized (osteo)arthritis: Secondary | ICD-10-CM

## 2021-06-09 DIAGNOSIS — M159 Polyosteoarthritis, unspecified: Secondary | ICD-10-CM

## 2021-06-09 MED ORDER — ZOLPIDEM TARTRATE ER 12.5 MG PO TBCR: 12.5000 mg | EXTENDED_RELEASE_TABLET | Freq: Every evening | ORAL | 0 refills | Status: DC | PRN

## 2021-06-09 NOTE — Telephone Encounter (Signed)
Meds ordered this encounter  Medications   zolpidem (AMBIEN CR) 12.5 MG CR tablet    Sig: Take 1 tablet (12.5 mg total) by mouth at bedtime as needed for sleep.    Dispense:  30 tablet    Refill:  0

## 2021-06-09 NOTE — Telephone Encounter (Signed)
Never got called for his facet joint injections, think this was done before we knew that the facet injections did not go into the work queue.  Please let [facility] know.

## 2021-06-10 ENCOUNTER — Telehealth: Payer: Self-pay

## 2021-06-10 NOTE — Telephone Encounter (Signed)
Initiated Prior authorization [id number] Tartrate ER 12.'5MG'$  er tablets Via: Covermymeds Case/Key:[id number] Status: approved as of 06/09/21 Reason:good till 01/15/22 Notified Pt via: Mychart

## 2021-06-15 ENCOUNTER — Other Ambulatory Visit: Payer: Self-pay | Admitting: Sports Medicine

## 2021-06-15 ENCOUNTER — Ambulatory Visit
Admission: RE | Admit: 2021-06-15 | Discharge: 2021-06-15 | Disposition: A | Discharge status: Home / Self Care | Payer: Medicare Other | Source: Ambulatory Visit | Attending: Sports Medicine | Admitting: Sports Medicine

## 2021-06-15 DIAGNOSIS — M47816 Spondylosis without myelopathy or radiculopathy, lumbar region: Secondary | ICD-10-CM

## 2021-06-15 MED ORDER — METHYLPREDNISOLONE ACETATE 40 MG/ML INJ SUSP (RADIOLOG
80.0000 mg | Freq: Once | INTRAMUSCULAR | Status: AC
  Administered 2021-06-15: 120 mg via INTRA_ARTICULAR

## 2021-06-15 MED ORDER — IOPAMIDOL (ISOVUE-M 200) INJECTION 41%
1.0000 mL | Freq: Once | INTRAMUSCULAR | Status: AC
  Administered 2021-06-15: 1 mL via INTRA_ARTICULAR

## 2021-06-15 NOTE — Discharge Instructions (Signed)

## 2021-06-21 ENCOUNTER — Other Ambulatory Visit: Payer: Self-pay | Admitting: Physician Assistant

## 2021-06-21 DIAGNOSIS — F419 Anxiety disorder, unspecified: Secondary | ICD-10-CM

## 2021-07-12 ENCOUNTER — Encounter: Payer: Self-pay | Admitting: Physician Assistant

## 2021-07-12 DIAGNOSIS — G4700 Insomnia, unspecified: Secondary | ICD-10-CM

## 2021-07-12 MED ORDER — ZOLPIDEM TARTRATE 10 MG PO TABS: 10.0000 mg | ORAL_TABLET | Freq: Every evening | ORAL | 1 refills | Status: DC | PRN

## 2021-08-01 ENCOUNTER — Encounter: Payer: Self-pay | Admitting: Physician Assistant

## 2021-08-01 MED ORDER — LISINOPRIL 20 MG PO TABS: 20.0000 mg | ORAL_TABLET | Freq: Every day | ORAL | 0 refills | Status: DC

## 2021-08-03 MED ORDER — LISINOPRIL 20 MG PO TABS: 20.0000 mg | ORAL_TABLET | Freq: Every day | ORAL | 0 refills | Status: DC

## 2021-08-03 NOTE — Addendum Note (Signed)
Addended by: Narda Rutherford on: 08/03/2021 01:20 PM   Modules accepted: Orders

## 2021-08-23 ENCOUNTER — Encounter: Payer: Self-pay | Admitting: Physician Assistant

## 2021-08-23 ENCOUNTER — Ambulatory Visit (INDEPENDENT_AMBULATORY_CARE_PROVIDER_SITE_OTHER): Payer: Medicare Other | Admitting: Physician Assistant

## 2021-08-23 VITALS — BP 135/63 | HR 66 | Ht 70.0 in | Wt 213.0 lb

## 2021-08-23 DIAGNOSIS — I10 Essential (primary) hypertension: Secondary | ICD-10-CM

## 2021-08-23 DIAGNOSIS — R4589 Other symptoms and signs involving emotional state: Secondary | ICD-10-CM

## 2021-08-23 DIAGNOSIS — G4700 Insomnia, unspecified: Secondary | ICD-10-CM

## 2021-08-23 DIAGNOSIS — G4733 Obstructive sleep apnea (adult) (pediatric): Secondary | ICD-10-CM | POA: Diagnosis not present

## 2021-08-23 MED ORDER — LISINOPRIL 20 MG PO TABS: 20.0000 mg | ORAL_TABLET | Freq: Every day | ORAL | 1 refills | Status: DC

## 2021-08-23 NOTE — Progress Notes (Signed)
Established Patient Office Visit  Subjective   Patient ID: Randall Hanson, male    DOB: October 09, 1950  Age: 71 y.o. MRN: [id number]  Chief Complaint  Patient presents with   Follow-up    HPI Pt is a 71 yo male with HTN, OSA on CPAP, insomnia, depressed mood, chronic pain syndrome with spondylolisthesis who presents to the clinic for follow up.   Pt is doing good. He is going to sleep and staying asleep much better but feels groggy in the mornings on ambien '10mg'$ .   He is checking BP at home and running 120-130s over 60-70s. No CP, palpitations, headaches or vision changes.   Back pain is doing ok but managed by Dr. Darene Lamer. He will follow up with him.   .. Active Ambulatory Problems    Diagnosis Date Noted   Benign essential hypertension 08/02/2020   Diverticulosis of large intestine without hemorrhage 06/21/2015   Dyslipidemia 02/15/2014   ED (erectile dysfunction) 12/29/2011   History of total knee arthroplasty, right 06/01/2014   OSA (obstructive sleep apnea) 09/16/2020   Personal history of pulmonary embolism 05/22/2018   Prediabetes 11/17/2019   Prostate cancer ([place]) 05/30/2016   Sensorineural hearing loss (SNHL), bilateral 09/16/2020   Tubular adenoma of colon 06/21/2015   Vitamin B 12 deficiency 10/23/2015   Combined forms of age-related cataract of left eye 07/24/2019   Insomnia 09/16/2020   Primary osteoarthritis involving multiple joints 09/16/2020   Lumbar spondylosis 09/16/2020   Anxiety 09/16/2020   Aortic atherosclerosis ([place]) 09/21/2020   Vitamin D insufficiency 12/22/2020   Depressed mood 02/23/2021   Chronic pain syndrome 02/23/2021   Left hip pain 02/23/2021   Resolved Ambulatory Problems    Diagnosis Date Noted   DDD (degenerative disc disease), lumbar 09/21/2020   Past Medical History:  Diagnosis Date   Arthritis    Colon polyps    High cholesterol    Hypertension    Joint pain    Sleep apnea      ROS See HPI.    Objective:     BP  135/63   Pulse 66   Ht '5\' 10"'$  (1.778 m)   Wt 213 lb (96.6 kg)   SpO2 97%   BMI 30.56 kg/m  BP Readings from Last 3 Encounters:  08/23/21 135/63  06/15/21 (!) 168/80  05/23/21 (!) 156/74   Wt Readings from Last 3 Encounters:  08/23/21 213 lb (96.6 kg)  05/23/21 211 lb (95.7 kg)  02/23/21 212 lb (96.2 kg)    ..    08/23/2021   11:21 AM 05/23/2021   10:45 AM 02/23/2021    7:35 AM 12/17/2020    9:28 AM 09/16/2020    8:59 AM  Depression screen PHQ 2/9  Decreased Interest 0 0 1 0 0  Down, Depressed, Hopeless 0 0 1 0 0  PHQ - 2 Score 0 0 2 0 0  Altered sleeping '1  2 2 1  '$ Tired, decreased energy '1  1 1 1  '$ Change in appetite 1  0 0 0  Feeling bad or failure about yourself  1  1 0 0  Trouble concentrating 0  0 0 0  Moving slowly or fidgety/restless 0  0 1 0  Suicidal thoughts 0  0 0 0  PHQ-9 Score '4  6 4 2  '$ Difficult doing work/chores Not difficult at all  Somewhat difficult Somewhat difficult Not difficult at all   ..    08/23/2021   11:21 AM 02/23/2021  7:36 AM 12/17/2020    9:29 AM 09/16/2020    9:00 AM  GAD 7 : Generalized Anxiety Score  Nervous, Anxious, on Edge 1 0 1 0  Control/stop worrying '1 1 1 1  '$ Worry too much - different things '1 2 1 1  '$ Trouble relaxing '1 1 1 1  '$ Restless 0 0 0 0  Easily annoyed or irritable 0 0 1 1  Afraid - awful might happen 0 0 0 0  Total GAD 7 Score '4 4 5 4  '$ Anxiety Difficulty Not difficult at all Somewhat difficult Somewhat difficult Not difficult at all      Physical Exam Constitutional:      Appearance: Normal appearance.  HENT:     Head: Normocephalic.  Cardiovascular:     Rate and Rhythm: Normal rate and regular rhythm.     Pulses: Normal pulses.  Pulmonary:     Effort: Pulmonary effort is normal.     Breath sounds: Normal breath sounds.  Neurological:     General: No focal deficit present.     Mental Status: He is alert.  Psychiatric:        Mood and Affect: Mood normal.        Assessment & Plan:  Marland KitchenMarland KitchenTaji was seen today  for follow-up.  Diagnoses and all orders for this visit:  Benign essential hypertension -     lisinopril (ZESTRIL) 20 MG tablet; Take 1 tablet (20 mg total) by mouth daily.  OSA (obstructive sleep apnea)  Depressed mood  Insomnia, unspecified type   BP looks great.  Sleeping better but groggy in mornings cut ambien in half and doing better will send '5mg'$  tablets.  PHQ/GAD numbers better and stable. Doing well with pain on cymbalta and as needed tramadol.      Return in about 6 months (around 02/23/2022).    Iran Planas, PA-C

## 2021-08-23 NOTE — Patient Instructions (Signed)
Cut ambien in half and see if this helps you feel less groggy.

## 2021-08-31 ENCOUNTER — Other Ambulatory Visit: Payer: Self-pay | Admitting: Physician Assistant

## 2021-08-31 DIAGNOSIS — G4700 Insomnia, unspecified: Secondary | ICD-10-CM

## 2021-08-31 NOTE — Telephone Encounter (Signed)
Last appt 08/23/2021 Last written 07/12/2021 #30 with 1 refill

## 2021-09-01 ENCOUNTER — Other Ambulatory Visit: Payer: Self-pay | Admitting: Physician Assistant

## 2021-09-01 DIAGNOSIS — M5442 Lumbago with sciatica, left side: Secondary | ICD-10-CM

## 2021-09-01 DIAGNOSIS — M150 Primary generalized (osteo)arthritis: Secondary | ICD-10-CM

## 2021-09-01 DIAGNOSIS — M159 Polyosteoarthritis, unspecified: Secondary | ICD-10-CM

## 2021-09-02 ENCOUNTER — Other Ambulatory Visit: Payer: Self-pay | Admitting: Physician Assistant

## 2021-09-02 ENCOUNTER — Other Ambulatory Visit: Payer: Self-pay | Admitting: Sports Medicine

## 2021-09-02 DIAGNOSIS — F419 Anxiety disorder, unspecified: Secondary | ICD-10-CM

## 2021-09-02 DIAGNOSIS — M4316 Spondylolisthesis, lumbar region: Secondary | ICD-10-CM

## 2021-09-02 NOTE — Telephone Encounter (Signed)
Last written 06/21/2021 #60 with 1 refill Last appt 08/23/2021

## 2021-09-04 ENCOUNTER — Emergency Department (HOSPITAL_COMMUNITY): Payer: Medicare Other

## 2021-09-04 ENCOUNTER — Encounter (HOSPITAL_COMMUNITY): Payer: Self-pay | Admitting: Family Medicine

## 2021-09-04 ENCOUNTER — Observation Stay (HOSPITAL_COMMUNITY)
Admission: EM | Admit: 2021-09-04 | Discharge: 2021-09-05 | Disposition: A | Discharge status: Other Acute Inpatient Hospital | Payer: Medicare Other | Attending: Family Medicine | Admitting: Family Medicine

## 2021-09-04 ENCOUNTER — Other Ambulatory Visit: Payer: Self-pay

## 2021-09-04 DIAGNOSIS — Z79899 Other long term (current) drug therapy: Secondary | ICD-10-CM | POA: Insufficient documentation

## 2021-09-04 DIAGNOSIS — T50912A Poisoning by multiple unspecified drugs, medicaments and biological substances, intentional self-harm, initial encounter: Principal | ICD-10-CM | POA: Diagnosis present

## 2021-09-04 DIAGNOSIS — Z8546 Personal history of malignant neoplasm of prostate: Secondary | ICD-10-CM | POA: Diagnosis not present

## 2021-09-04 DIAGNOSIS — M159 Polyosteoarthritis, unspecified: Secondary | ICD-10-CM | POA: Diagnosis present

## 2021-09-04 DIAGNOSIS — G4733 Obstructive sleep apnea (adult) (pediatric): Secondary | ICD-10-CM | POA: Diagnosis not present

## 2021-09-04 DIAGNOSIS — G929 Unspecified toxic encephalopathy: Secondary | ICD-10-CM | POA: Insufficient documentation

## 2021-09-04 DIAGNOSIS — Z20822 Contact with and (suspected) exposure to covid-19: Secondary | ICD-10-CM | POA: Diagnosis not present

## 2021-09-04 DIAGNOSIS — N179 Acute kidney failure, unspecified: Secondary | ICD-10-CM

## 2021-09-04 DIAGNOSIS — Z96659 Presence of unspecified artificial knee joint: Secondary | ICD-10-CM | POA: Diagnosis not present

## 2021-09-04 DIAGNOSIS — Z87891 Personal history of nicotine dependence: Secondary | ICD-10-CM | POA: Insufficient documentation

## 2021-09-04 DIAGNOSIS — M1612 Unilateral primary osteoarthritis, left hip: Secondary | ICD-10-CM | POA: Diagnosis present

## 2021-09-04 DIAGNOSIS — G8929 Other chronic pain: Secondary | ICD-10-CM | POA: Diagnosis not present

## 2021-09-04 DIAGNOSIS — I10 Essential (primary) hypertension: Secondary | ICD-10-CM | POA: Diagnosis not present

## 2021-09-04 DIAGNOSIS — T1491XA Suicide attempt, initial encounter: Secondary | ICD-10-CM

## 2021-09-04 DIAGNOSIS — J690 Pneumonitis due to inhalation of food and vomit: Secondary | ICD-10-CM | POA: Diagnosis not present

## 2021-09-04 DIAGNOSIS — C61 Malignant neoplasm of prostate: Secondary | ICD-10-CM | POA: Diagnosis present

## 2021-09-04 DIAGNOSIS — M25552 Pain in left hip: Secondary | ICD-10-CM | POA: Diagnosis not present

## 2021-09-04 LAB — ACETAMINOPHEN LEVEL
Acetaminophen (Tylenol), Serum: <10 ug/mL — ABNORMAL LOW (ref 10–30)
Acetaminophen (Tylenol), Serum: <10 ug/mL — ABNORMAL LOW (ref 10–30)

## 2021-09-04 LAB — COMPREHENSIVE METABOLIC PANEL
ALT: 15 U/L (ref 0–44)
AST: 18 U/L (ref 15–41)
Albumin: 4.1 g/dL (ref 3.5–5.0)
Alkaline Phosphatase: 37 U/L — ABNORMAL LOW (ref 38–126)
Anion gap: 8 (ref 5–15)
BUN: 24 mg/dL — ABNORMAL HIGH (ref 8–23)
CO2: 27 mmol/L (ref 22–32)
Calcium: 8.6 mg/dL — ABNORMAL LOW (ref 8.9–10.3)
Chloride: 107 mmol/L (ref 98–111)
Creatinine, Ser: 1.39 mg/dL — ABNORMAL HIGH (ref 0.61–1.24)
GFR, Estimated: 55 mL/min — ABNORMAL LOW (ref >60)
Glucose, Bld: 106 mg/dL — ABNORMAL HIGH (ref 70–99)
Potassium: 3.5 mmol/L (ref 3.5–5.1)
Sodium: 142 mmol/L (ref 135–145)
Total Bilirubin: 0.7 mg/dL (ref 0.3–1.2)
Total Protein: 7 g/dL (ref 6.5–8.1)

## 2021-09-04 LAB — CBC
HCT: 41.4 % (ref 39.0–52.0)
Hemoglobin: 13.8 g/dL (ref 13.0–17.0)
MCH: 32.9 pg (ref 26.0–34.0)
MCHC: 33.3 g/dL (ref 30.0–36.0)
MCV: 98.6 fL (ref 80.0–100.0)
Platelets: 229 10*3/uL (ref 150–400)
RBC: 4.2 MIL/uL — ABNORMAL LOW (ref 4.22–5.81)
RDW: 13.1 % (ref 11.5–15.5)
WBC: 8.7 10*3/uL (ref 4.0–10.5)
nRBC: 0 % (ref 0.0–0.2)

## 2021-09-04 LAB — SALICYLATE LEVEL: Salicylate Lvl: <7 mg/dL — ABNORMAL LOW (ref 7.0–30.0)

## 2021-09-04 LAB — MRSA NEXT GEN BY PCR, NASAL: MRSA by PCR Next Gen: NOT DETECTED

## 2021-09-04 LAB — CBG MONITORING, ED: Glucose-Capillary: 99 mg/dL (ref 70–99)

## 2021-09-04 LAB — ETHANOL: Alcohol, Ethyl (B): <10 mg/dL (ref <10)

## 2021-09-04 MED ORDER — LACTATED RINGERS IV SOLN: INTRAVENOUS | Status: DC

## 2021-09-04 MED ORDER — CHLORHEXIDINE GLUCONATE CLOTH 2 % EX PADS
6.0000 | MEDICATED_PAD | Freq: Every day | CUTANEOUS | Status: DC
  Administered 2021-09-04 – 2021-09-05 (×2): 6 via TOPICAL

## 2021-09-04 MED ORDER — LACTATED RINGERS IV BOLUS
1000.0000 mL | Freq: Once | INTRAVENOUS | Status: AC
  Administered 2021-09-04: 1000 mL via INTRAVENOUS

## 2021-09-04 MED ORDER — ORAL CARE MOUTH RINSE: 15.0000 mL | OROMUCOSAL | Status: DC | PRN

## 2021-09-04 MED ORDER — SODIUM CHLORIDE 0.9 % IV SOLN
3.0000 g | Freq: Once | INTRAVENOUS | Status: AC
  Administered 2021-09-04: 3 g via INTRAVENOUS
  Filled 2021-09-04: qty 8

## 2021-09-04 MED ORDER — SODIUM CHLORIDE 0.9% FLUSH
3.0000 mL | Freq: Two times a day (BID) | INTRAVENOUS | Status: DC
  Administered 2021-09-05: 3 mL via INTRAVENOUS

## 2021-09-04 NOTE — Hospital Course (Signed)
 --  Seen by PCP 8/8 PMH includes depressed mood, insomnia, chronic pain syndrome.  Noted to be doing well at that time.  Ambien cut in half. --Seen by sleep Center 6/20 for moderate OSA on CPAP --Seen by urology 5/15 for follow-up prostate cancer status post prostatectomy, noted to be doing well with follow-up in 6 months.

## 2021-09-04 NOTE — ED Provider Notes (Signed)
[facility] DEPT Provider Note   CSN: [id number] Arrival date & time: 09/04/21  1258     History  Chief Complaint  Patient presents with   Ingestion   Suicide Attempt    Randall Hanson is a 71 y.o. male.  71 year old male presents with suicide attempt.  Patient found this morning altered and there was a note by the bedside.  According to EMS and the patient, he had a fight with his wife.  Took an unknown known amount of various pills including clonazepam tramadol Cymbalta.  Denies any alcohol, Tylenol, acetaminophen ingestion.  Wife called EMS and transported patient here patient is history is limited due to his current state       Home Medications Prior to Admission medications   Medication Sig Start Date End Date Taking? Authorizing Provider  clonazePAM (KLONOPIN) 0.5 MG tablet TAKE ONE TABLET BY MOUTH TWICE DAILY AS NEEDED FOR ANXIETY 09/02/21   Breeback, Jade L, PA-C  DULoxetine (CYMBALTA) 30 MG capsule Take 1 capsule (30 mg total) by mouth daily. 05/23/21   Breeback, Jade L, PA-C  lisinopril (ZESTRIL) 20 MG tablet Take 1 tablet (20 mg total) by mouth daily. 08/23/21   Breeback, Jade L, PA-C  meloxicam (MOBIC) 15 MG tablet TAKE ONE TABLET (15 MG) BY MOUTH DAILY 09/01/21   Breeback, Jade L, PA-C  simvastatin (ZOCOR) 40 MG tablet Take 40 mg by mouth daily.    [provider]  traMADol (ULTRAM) 50 MG tablet Take 1 tablet (50 mg total) by mouth every 12 (twelve) hours as needed. 04/11/21 08/23/21  Silverio Decamp, MD  vitamin B-12 (CYANOCOBALAMIN) 500 MCG tablet Take 500 mcg by mouth daily.    [provider]  zolpidem (AMBIEN) 10 MG tablet Take 1 tablet (10 mg total) by mouth at bedtime as needed for sleep. 08/31/21   Breeback, Royetta Car, PA-C      Allergies    Mirtazapine and Trazodone and nefazodone    Review of Systems   Review of Systems  Unable to perform ROS: Psychiatric disorder    Physical Exam Updated Vital Signs BP  (!) 152/70 (BP Location: Left Arm)   Pulse 78   Temp (!) 97.2 F (36.2 C) (Oral)   Resp 12   SpO2 94%  Physical Exam Vitals and nursing note reviewed.  Constitutional:      General: He is not in acute distress.    Appearance: Normal appearance. He is well-developed. He is not toxic-appearing.  HENT:     Head: Normocephalic and atraumatic.  Eyes:     General: Lids are normal.     Conjunctiva/sclera: Conjunctivae normal.     Pupils: Pupils are equal, round, and reactive to light.  Neck:     Thyroid: No thyroid mass.     Trachea: No tracheal deviation.  Cardiovascular:     Rate and Rhythm: Normal rate and regular rhythm.     Heart sounds: Normal heart sounds. No murmur heard.    No gallop.  Pulmonary:     Effort: Pulmonary effort is normal. No respiratory distress.     Breath sounds: Normal breath sounds. No stridor. No decreased breath sounds, wheezing, rhonchi or rales.  Abdominal:     General: There is no distension.     Palpations: Abdomen is soft.     Tenderness: There is no abdominal tenderness. There is no rebound.  Musculoskeletal:        General: No tenderness. Normal range of motion.  Cervical back: Normal range of motion and neck supple.  Skin:    General: Skin is warm and dry.     Findings: No abrasion or rash.  Neurological:     Mental Status: He is alert and oriented to person, place, and time. Mental status is at baseline.     GCS: GCS eye subscore is 4. GCS verbal subscore is 5. GCS motor subscore is 6.     Cranial Nerves: No cranial nerve deficit.     Sensory: No sensory deficit.     Motor: Motor function is intact.  Psychiatric:        Attention and Perception: Attention normal.        Speech: Speech normal.        Behavior: Behavior normal.        Thought Content: Thought content includes suicidal ideation. Thought content includes suicidal plan.     ED Results / Procedures / Treatments   Labs (all labs ordered are listed, but only abnormal  results are displayed) Labs Reviewed  COMPREHENSIVE METABOLIC PANEL  ETHANOL  SALICYLATE LEVEL  ACETAMINOPHEN LEVEL  CBC  RAPID URINE DRUG SCREEN, HOSP PERFORMED  CBG MONITORING, ED    EKG EKG Interpretation  Date/Time:  Sunday September 04 2021 13:21:11 EDT Ventricular Rate:  73 PR Interval:  197 QRS Duration: 101 QT Interval:  407 QTC Calculation: 449 R Axis:   -26 Text Interpretation: Sinus rhythm Low voltage, precordial leads Abnormal R-wave progression, early transition Left ventricular hypertrophy Confirmed by Lacretia Leigh ([id number]) on 09/04/2021 2:27:39 PM  Radiology No results found.  Procedures Procedures    Medications Ordered in ED Medications - No data to display  ED Course/ Medical Decision Making/ A&P                           Medical Decision Making Amount and/or Complexity of Data Reviewed Labs: ordered. Radiology: ordered. ECG/medicine tests: ordered.  Risk Prescription drug management.   His EKG for interpretation shows normal sinus rhythm.  Patient is responsive to stimulus here.  Patient able to protect his airway.  Chest x-ray per interpretation shows likely early aspiration pneumonia.  Patient started on Unasyn for this.  Poison control contacted by nursing notes recommendations reviewed.  Recommend patient have repeat Tylenol level in 4 hours.  Slight AKI noted and patient given 1 L of lactated Ringer's.  Patient examined at times.  Spoke with patient's wife who states that there was indeed a suicide letter at the bedside.  No prior history of suicide attempts.  Patient maintaining sats of 98% on 2 L of oxygen.  Patient will require admission to stepdown.  Consult hospitalist  CRITICAL CARE Performed by: Leota Jacobsen Total critical care time: 60 minutes Critical care time was exclusive of separately billable procedures and treating other patients. Critical care was necessary to treat or prevent imminent or life-threatening  deterioration. Critical care was time spent personally by me on the following activities: development of treatment plan with patient and/or surrogate as well as nursing, discussions with consultants, evaluation of patient's response to treatment, examination of patient, obtaining history from patient or surrogate, ordering and performing treatments and interventions, ordering and review of laboratory studies, ordering and review of radiographic studies, pulse oximetry and re-evaluation of patient's condition.         Final Clinical Impression(s) / ED Diagnoses Final diagnoses:  None    Rx / DC Orders ED Discharge Orders  None         Lacretia Leigh, MD 09/04/21 [phone]

## 2021-09-04 NOTE — H&P (Addendum)
History and Physical    Patient: Randall Hanson JIR:[id number] DOB: [birth year] DOA: 09/04/2021 DOS: the patient was seen and examined on 09/04/2021 PCP: Donella Stade, PA-C  Patient coming from: Home  Chief Complaint:  Chief Complaint  Patient presents with   Ingestion   Suicide Attempt   HPI: 71 year old man PMH including prostate cancer, sleep apnea, depressed mood, presented to the emergency department after suicide attempt with polypharmacy overdose.  Plan for medical observation and coordination of care with poison control.  Limited history obtained from the patient he is mildly encephalopathic.  He reports an argument with his wife last evening, after which he wanted to "end it all".  When questioned, he admits that he wanted to kill himself.  He reports taking multiple prescribed medications.  No Tylenol or NSAIDs.  Per documentation this was duloxetine, tramadol, clonazepam (takes twice a day every day), Ambien.  Unclear how much of each.  Also left a suicide note.  At the moment he feels poorly but complaints are nonspecific.  Further information obtained from wife at bedside.  Patient has chronic back pain and difficulty walking.  He performs a lot of tasks for his in-laws as well as around the house.  He has been depressed at this time of year as he seems to lack purpose in her review.  She tries to give him work to do to help him.  He also is jealous of her son and her parents and apparently his sister took his inheritance.  Documentation does note there was a gun at the bedside, wife reports that this is [place] and there is always a gun in the bedroom.  This was not a little per her.  Review of Systems:  Negative for fever, changes to vision that are new, rash, new muscle aches, chest pain, shortness of breath, dysuria, bleeding,.  Positive for nausea, negative for vomiting.  Positive for sore throat.  Past Medical History:  Diagnosis  Date   Arthritis    Colon polyps    High cholesterol    Hypertension    Joint pain    Prostate cancer ([place])    Sleep apnea    Past Surgical History:  Procedure Laterality Date   LASIK Bilateral    PROSTATECTOMY     REPLACEMENT TOTAL KNEE     SHOULDER SURGERY     Social History:  reports that he has quit smoking. His smoking use included cigarettes. He has never used smokeless tobacco. He reports current alcohol use of about 1.0 standard drink of alcohol per week. He reports that he does not use drugs.  Allergies  Allergen Reactions   Mirtazapine     "Made me feel awful"   Trazodone And Nefazodone     Vision blurry/eyes water    Family History  Problem Relation Age of Onset   Cancer Father     Prior to Admission medications   Medication Sig Start Date End Date Taking? Authorizing Provider  clonazePAM (KLONOPIN) 0.5 MG tablet TAKE ONE TABLET BY MOUTH TWICE DAILY AS NEEDED FOR ANXIETY 09/02/21  Yes Breeback, Jade L, PA-C  DULoxetine (CYMBALTA) 30 MG capsule Take 1 capsule (30 mg total) by mouth daily. 05/23/21  Yes Breeback, Jade L, PA-C  lisinopril (ZESTRIL) 20 MG tablet Take 1 tablet (20 mg total) by mouth daily. 08/23/21  Yes Breeback, Jade L, PA-C  meloxicam (MOBIC) 15 MG tablet TAKE ONE TABLET (15 MG) BY MOUTH DAILY 09/01/21  Yes Breeback, Jade L, PA-C  simvastatin (ZOCOR) 40 MG tablet Take 40 mg by mouth daily.   Yes [provider]  traMADol (ULTRAM) 50 MG tablet Take 50 mg by mouth every 12 (twelve) hours as needed for moderate pain or severe pain.   Yes [provider]  vitamin B-12 (CYANOCOBALAMIN) 500 MCG tablet Take 500 mcg by mouth daily.   Yes [provider]  zolpidem (AMBIEN) 10 MG tablet Take 1 tablet (10 mg total) by mouth at bedtime as needed for sleep. 08/31/21  Yes Donella Stade, PA-C    Physical Exam: Vitals:   09/04/21 1309 09/04/21 1314 09/04/21 1420 09/04/21 1458  BP:   120/64 139/71  Pulse:   64 67  Resp:   (!) 6 16  Temp:       TempSrc:      SpO2: 96% 94% 94% 97%   Physical Exam Vitals reviewed.  Constitutional:      General: He is not in acute distress.    Appearance: He is ill-appearing. He is not toxic-appearing.  Eyes:     Comments: Sclera are injected.  Pupils appear unremarkable.  Eye movements appear to be intact.  Cardiovascular:     Rate and Rhythm: Normal rate and regular rhythm.     Heart sounds: No murmur heard. Pulmonary:     Effort: Pulmonary effort is normal. No respiratory distress.     Breath sounds: No wheezing, rhonchi or rales.  Abdominal:     General: There is no distension.     Palpations: Abdomen is soft.     Tenderness: There is no abdominal tenderness. There is no guarding.  Musculoskeletal:     Cervical back: No tenderness.     Right lower leg: No edema.     Left lower leg: No edema.  Skin:    Findings: No rash.  Neurological:     General: No focal deficit present.     Mental Status: He is alert.  Psychiatric:        Attention and Perception: He is inattentive.        Mood and Affect: Mood is depressed. Affect is flat.        Behavior: Behavior is slowed. Behavior is not hyperactive.        Thought Content: Thought content includes suicidal ideation.     Data Reviewed:  Creatinine elevated 1.39, remainder CMP unremarkable.  CBC unremarkable.  Tylenol and salicylate negative. Chest x-ray independently reviewed, possible right lung base infiltrate.  EKG independently reviewed sinus rhythm, no acute changes.  Assessment and Plan: Polypharmacy overdose, intentional, suicide attempt superimposed on depressed mood treated with Cymbalta.  Associated with acute toxic encephalopathy -- Reportedly ingested multiple tablets of Klonopin, Ambien, Cymbalta, tramadol. -- Per poison control, monitor with telemetry 8 hours, repeat Tylenol level in 4 hours, supportive care. Repeat EKG before medically clearing. -- Psychiatry consultation once improved.  Cannot leave, if attempts  to leave, will need involuntary commitment.  Acute kidney injury with elevated BUN, possibly simple dehydration -- IV fluids.  Check CMP in AM.  Possible aspiration pneumonitis.  Afebrile, no leukocytosis, no documented hypoxia.  No further antibiotics.  Monitor clinically.  Obstructive sleep apnea -- CPAP nightly.  Chronic pain including chronic left hip pain -- Treated with Cymbalta and tramadol, currently on hold.   Advance Care Planning: Full code  Consults: none presently but psychiatry when improved  Family Communication: wife at bedside  Severity of Illness: The appropriate patient status for  this patient is OBSERVATION. Observation status is judged to be reasonable and necessary in order to provide the required intensity of service to ensure the patient's safety. The patient's presenting symptoms, physical exam findings, and initial radiographic and laboratory data in the context of their medical condition is felt to place them at decreased risk for further clinical deterioration. Furthermore, it is anticipated that the patient will be medically stable for discharge from the hospital within 2 midnights of admission.   Author: Murray Hodgkins, MD 09/04/2021 4:41 PM  For on call review [email or web address].

## 2021-09-04 NOTE — ED Triage Notes (Signed)
Pt BIBA from home. Pt had argument w/wife last night. Wife found pt this morning lethargic, w/suicide note and gun next to them.   Pill bottles found_ Duloxetine, Tramadol, Clonazepam, Zolplidem. Unknown amounts ingested.  Pt speech is slow, and slurred.

## 2021-09-04 NOTE — ED Notes (Signed)
Poison control contacted. Recommend 8 hr monitoring, and repeat acetaminophen in 4 hrs.

## 2021-09-05 ENCOUNTER — Other Ambulatory Visit: Payer: Self-pay

## 2021-09-05 ENCOUNTER — Inpatient Hospital Stay
Admission: AD | Admit: 2021-09-05 | Discharge: 2021-09-09 | DRG: 885 | Disposition: A | Discharge status: Home / Self Care | Payer: Medicare Other | Source: Intra-hospital | Attending: Psychiatry | Admitting: Psychiatry

## 2021-09-05 DIAGNOSIS — I10 Essential (primary) hypertension: Secondary | ICD-10-CM | POA: Diagnosis present

## 2021-09-05 DIAGNOSIS — N179 Acute kidney failure, unspecified: Secondary | ICD-10-CM | POA: Diagnosis not present

## 2021-09-05 DIAGNOSIS — G4700 Insomnia, unspecified: Secondary | ICD-10-CM | POA: Diagnosis present

## 2021-09-05 DIAGNOSIS — M4316 Spondylolisthesis, lumbar region: Secondary | ICD-10-CM | POA: Diagnosis present

## 2021-09-05 DIAGNOSIS — M549 Dorsalgia, unspecified: Secondary | ICD-10-CM | POA: Diagnosis present

## 2021-09-05 DIAGNOSIS — T426X2A Poisoning by other antiepileptic and sedative-hypnotic drugs, intentional self-harm, initial encounter: Secondary | ICD-10-CM | POA: Diagnosis present

## 2021-09-05 DIAGNOSIS — T50912A Poisoning by multiple unspecified drugs, medicaments and biological substances, intentional self-harm, initial encounter: Secondary | ICD-10-CM | POA: Diagnosis not present

## 2021-09-05 DIAGNOSIS — F331 Major depressive disorder, recurrent, moderate: Principal | ICD-10-CM | POA: Diagnosis present

## 2021-09-05 DIAGNOSIS — M4310 Spondylolisthesis, site unspecified: Secondary | ICD-10-CM | POA: Diagnosis present

## 2021-09-05 DIAGNOSIS — Z87891 Personal history of nicotine dependence: Secondary | ICD-10-CM

## 2021-09-05 DIAGNOSIS — T424X2A Poisoning by benzodiazepines, intentional self-harm, initial encounter: Secondary | ICD-10-CM | POA: Diagnosis present

## 2021-09-05 DIAGNOSIS — G4733 Obstructive sleep apnea (adult) (pediatric): Secondary | ICD-10-CM | POA: Diagnosis not present

## 2021-09-05 DIAGNOSIS — Z20822 Contact with and (suspected) exposure to covid-19: Secondary | ICD-10-CM | POA: Diagnosis present

## 2021-09-05 DIAGNOSIS — E7800 Pure hypercholesterolemia, unspecified: Secondary | ICD-10-CM | POA: Diagnosis present

## 2021-09-05 DIAGNOSIS — J690 Pneumonitis due to inhalation of food and vomit: Secondary | ICD-10-CM | POA: Diagnosis not present

## 2021-09-05 DIAGNOSIS — G8929 Other chronic pain: Secondary | ICD-10-CM | POA: Diagnosis present

## 2021-09-05 DIAGNOSIS — Z8546 Personal history of malignant neoplasm of prostate: Secondary | ICD-10-CM

## 2021-09-05 DIAGNOSIS — Z96659 Presence of unspecified artificial knee joint: Secondary | ICD-10-CM | POA: Diagnosis present

## 2021-09-05 LAB — COMPREHENSIVE METABOLIC PANEL
ALT: 14 U/L (ref 0–44)
AST: 20 U/L (ref 15–41)
Albumin: 3.2 g/dL — ABNORMAL LOW (ref 3.5–5.0)
Alkaline Phosphatase: 35 U/L — ABNORMAL LOW (ref 38–126)
Anion gap: 6 (ref 5–15)
BUN: 23 mg/dL (ref 8–23)
CO2: 27 mmol/L (ref 22–32)
Calcium: 8.2 mg/dL — ABNORMAL LOW (ref 8.9–10.3)
Chloride: 108 mmol/L (ref 98–111)
Creatinine, Ser: 1.14 mg/dL (ref 0.61–1.24)
GFR, Estimated: >60 mL/min (ref >60)
Glucose, Bld: 93 mg/dL (ref 70–99)
Potassium: 4.2 mmol/L (ref 3.5–5.1)
Sodium: 141 mmol/L (ref 135–145)
Total Bilirubin: 0.9 mg/dL (ref 0.3–1.2)
Total Protein: 5.6 g/dL — ABNORMAL LOW (ref 6.5–8.1)

## 2021-09-05 LAB — CBC
HCT: 37.9 % — ABNORMAL LOW (ref 39.0–52.0)
Hemoglobin: 12.2 g/dL — ABNORMAL LOW (ref 13.0–17.0)
MCH: 32.6 pg (ref 26.0–34.0)
MCHC: 32.2 g/dL (ref 30.0–36.0)
MCV: 101.3 fL — ABNORMAL HIGH (ref 80.0–100.0)
Platelets: 192 10*3/uL (ref 150–400)
RBC: 3.74 MIL/uL — ABNORMAL LOW (ref 4.22–5.81)
RDW: 13.1 % (ref 11.5–15.5)
WBC: 7.9 10*3/uL (ref 4.0–10.5)
nRBC: 0 % (ref 0.0–0.2)

## 2021-09-05 LAB — RAPID URINE DRUG SCREEN, HOSP PERFORMED
Amphetamines: NOT DETECTED
Barbiturates: NOT DETECTED
Benzodiazepines: POSITIVE — AB
Cocaine: NOT DETECTED
Opiates: NOT DETECTED
Tetrahydrocannabinol: NOT DETECTED

## 2021-09-05 LAB — SARS CORONAVIRUS 2 BY RT PCR: SARS Coronavirus 2 by RT PCR: NEGATIVE

## 2021-09-05 LAB — HIV ANTIBODY (ROUTINE TESTING W REFLEX): HIV Screen 4th Generation wRfx: NONREACTIVE

## 2021-09-05 MED ORDER — MAGNESIUM HYDROXIDE 400 MG/5ML PO SUSP: 30.0000 mL | Freq: Every day | ORAL | Status: DC | PRN

## 2021-09-05 MED ORDER — CLONAZEPAM 0.5 MG PO TABS: 0.5000 mg | ORAL_TABLET | Freq: Every evening | ORAL | Status: DC | PRN

## 2021-09-05 MED ORDER — ACETAMINOPHEN 325 MG PO TABS: 650.0000 mg | ORAL_TABLET | Freq: Four times a day (QID) | ORAL | Status: DC | PRN

## 2021-09-05 MED ORDER — ALUM & MAG HYDROXIDE-SIMETH 200-200-20 MG/5ML PO SUSP: 30.0000 mL | ORAL | Status: DC | PRN

## 2021-09-05 MED ORDER — MELOXICAM 15 MG PO TABS
15.0000 mg | ORAL_TABLET | Freq: Every day | ORAL | Status: DC
  Administered 2021-09-05: 15 mg via ORAL
  Filled 2021-09-05: qty 1

## 2021-09-05 MED ORDER — SIMVASTATIN 40 MG PO TABS
40.0000 mg | ORAL_TABLET | Freq: Every day | ORAL | Status: DC
  Administered 2021-09-05: 40 mg via ORAL
  Filled 2021-09-05: qty 1

## 2021-09-05 MED ORDER — LISINOPRIL 20 MG PO TABS
20.0000 mg | ORAL_TABLET | Freq: Every day | ORAL | Status: DC
  Administered 2021-09-06 – 2021-09-09 (×4): 20 mg via ORAL
  Filled 2021-09-05 (×4): qty 1

## 2021-09-05 MED ORDER — LISINOPRIL 20 MG PO TABS
20.0000 mg | ORAL_TABLET | Freq: Every day | ORAL | Status: DC
  Administered 2021-09-05: 20 mg via ORAL
  Filled 2021-09-05: qty 1

## 2021-09-05 MED ORDER — ENOXAPARIN SODIUM 40 MG/0.4ML IJ SOSY
40.0000 mg | PREFILLED_SYRINGE | INTRAMUSCULAR | Status: DC
  Administered 2021-09-05: 40 mg via SUBCUTANEOUS
  Filled 2021-09-05: qty 0.4

## 2021-09-05 NOTE — Progress Notes (Signed)
  Progress Note   Patient: Randall Hanson HYI:[id number] DOB: Nov 28, 1950 DOA: 09/04/2021     0 DOS: the patient was seen and examined on 09/05/2021   Brief hospital course: 71 year old man PMH including prostate cancer, sleep apnea, depressed mood, presented to the emergency department after suicide attempt with polypharmacy overdose.  Plan for medical observation and coordination of care with poison control.  Assessment and Plan: Polypharmacy overdose, intentional, suicide attempt superimposed on depressed mood treated with Cymbalta. Associated with acute toxic encephalopathy -- Reportedly ingested multiple tablets of Klonopin, Ambien, Cymbalta, tramadol. -- Per poison control, monitor with telemetry 8 hours, repeat Tylenol level in 4 hours, supportive care. Repeat EKG before medically clearing. --Tylenol level negative, repeat EKG SR 1 AVB no acute changes -- Patient is medically clear. Psychiatry consultation today.  Cannot leave, if attempts to leave, will need involuntary commitment.   Acute kidney injury with elevated BUN, possibly simple dehydration. Baseline 1.04 -- resolved   Possible aspiration pneumonitis.  Afebrile, no leukocytosis, no documented hypoxia.  No further antibiotics.  Monitor clinically. -- Appears clinically resolved.  No hypoxia.  No further evaluation.   Obstructive sleep apnea -- CPAP nightly.   Chronic pain including chronic left hip pain -- Treated with Cymbalta and tramadol, currently on hold.  Patient is medically clear      Subjective:  Feels better Slept well No new pain Breathing fine  Physical Exam: Vitals:   09/05/21 0633 09/05/21 0700 09/05/21 0726 09/05/21 0800  BP:  (!) 166/67  (!) 188/91  Pulse: 66 (!) 53  61  Resp: '13 10  12  '$ Temp: (!) 97.3 F (36.3 C)  97.7 F (36.5 C)   TempSrc: Axillary  Oral   SpO2: 98% 97%  99%   Physical Exam Vitals reviewed.  Constitutional:      General: He is not in acute distress.     Appearance: He is not ill-appearing or toxic-appearing.  Cardiovascular:     Rate and Rhythm: Normal rate and regular rhythm.     Heart sounds: No murmur heard.    Comments: Telemetry SB Pulmonary:     Effort: Pulmonary effort is normal. No respiratory distress.     Breath sounds: No wheezing, rhonchi or rales.  Neurological:     Mental Status: He is alert.  Psychiatric:        Mood and Affect: Mood normal.        Behavior: Behavior normal.     Data Reviewed:  CMP unremarkable UDS BZD+ Tylenol negative  Family Communication: wife at bedside  Disposition: Status is: Observation The patient remains OBS appropriate and will d/c before 2 midnights.  Planned Discharge Destination:  likely inpt psychiatry    Time spent: 20 minutes  Author: Murray Hodgkins, MD 09/05/2021 8:56 AM  For on call review [email or web address].

## 2021-09-05 NOTE — Progress Notes (Signed)
PIV's removed. Wife has patient packed and ready to DC to [facility] in [place]. Will call report to accepting facility at [place].

## 2021-09-05 NOTE — Discharge Summary (Signed)
Physician Discharge Summary   Patient: Randall Hanson MRN: [id number] DOB: [birth year]  Admit date:     09/04/2021  Discharge date: 09/05/21  Discharge Physician: Murray Hodgkins   PCP: Donella Stade, PA-C   Recommendations at discharge:   Suicide attempt  Discharge Diagnoses: Principal Problem:   Suicide attempt by multiple drug overdose [facility]) Active Problems:   OSA (obstructive sleep apnea)   Prostate cancer ([place])   Primary osteoarthritis involving multiple joints   Left hip pain   AKI (acute kidney injury) ([place])   Aspiration pneumonitis ([place])  Resolved Problems:   * No resolved hospital problems. *  Hospital Course: 71 year old man PMH including prostate cancer, sleep apnea, depressed mood, presented to the emergency department after suicide attempt with polypharmacy overdose.  Was observed overnight without apparent sequela.  Hospitalization uncomplicated.  Cleared medically.  Seen by psychiatry with recommendation for inpatient psychiatric treatment.  Polypharmacy overdose, intentional, suicide attempt superimposed on depressed mood treated with Cymbalta. Associated with acute toxic encephalopathy -- Reportedly ingested multiple tablets of Klonopin, Ambien, Cymbalta, tramadol. -- Per poison control, monitor with telemetry 8 hours, repeat Tylenol level in 4 hours, supportive care. Repeat EKG before medically clearing. --Tylenol level negative, repeat EKG SR 1 AVB no acute changes -- Patient is medically clear. Psychiatry consultation today.  Cannot leave, if attempts to leave, will need involuntary commitment.   Acute kidney injury with elevated BUN, possibly simple dehydration. Baseline 1.04 -- resolved   Possible aspiration pneumonitis.  Afebrile, no leukocytosis, no documented hypoxia.  No further antibiotics.  Monitor clinically. -- Appears clinically resolved.  No hypoxia.  No further evaluation.   Obstructive sleep apnea -- CPAP nightly.   Chronic pain  including chronic left hip pain -- Treated with Cymbalta and tramadol, currently on hold.   Patient is medically clear     Consultants:  Psychiatry   Procedures performed:  None    Disposition:  Inpatient psychiatric treatment Diet recommendation:  Regular diet DISCHARGE MEDICATION: Allergies as of 09/05/2021       Reactions   Mirtazapine    "Made me feel awful"   Trazodone And Nefazodone    Vision blurry/eyes water        Medication List     STOP taking these medications    DULoxetine 30 MG capsule Commonly known as: CYMBALTA   traMADol 50 MG tablet Commonly known as: ULTRAM   zolpidem 10 MG tablet Commonly known as: AMBIEN       TAKE these medications    clonazePAM 0.5 MG tablet Commonly known as: KLONOPIN Take 1 tablet (0.5 mg total) by mouth at bedtime as needed (anxiety). What changed:  when to take this reasons to take this additional instructions   cyanocobalamin 500 MCG tablet Commonly known as: VITAMIN B12 Take 500 mcg by mouth daily.   lisinopril 20 MG tablet Commonly known as: ZESTRIL Take 1 tablet (20 mg total) by mouth daily.   meloxicam 15 MG tablet Commonly known as: MOBIC TAKE ONE TABLET (15 MG) BY MOUTH DAILY   simvastatin 40 MG tablet Commonly known as: ZOCOR Take 40 mg by mouth daily.        Discharge Exam: Filed Weights   09/05/21 1700  Weight: 96 kg   See exam in progress note same day  Condition at discharge: good  The results of significant diagnostics from this hospitalization (including imaging, microbiology, ancillary and laboratory) are listed below for reference.   Imaging Studies: DG Chest Portable 1 View  Result Date: 09/04/2021 CLINICAL DATA:  Lethargy EXAM: PORTABLE CHEST 1 VIEW COMPARISON:  None Available. FINDINGS: Cardiomegaly. Heterogeneous airspace opacity of the right lung base. The visualized skeletal structures are unremarkable. IMPRESSION: 1.  Cardiomegaly. 2. Heterogeneous airspace  opacity of the right lung base which may reflect infection, atelectasis, and/or scarring. PA and lateral chest radiographs may be helpful to further evaluate. Electronically Signed   By: Delanna Ahmadi M.D.   On: 09/04/2021 13:45    Microbiology: Results for orders placed or performed during the hospital encounter of 09/04/21  MRSA Next Gen by PCR, Nasal     Status: None   Collection Time: 09/04/21  6:43 PM   Specimen: Nasal Mucosa; Nasal Swab  Result Value Ref Range Status   MRSA by PCR Next Gen NOT DETECTED NOT DETECTED Final    Comment: (NOTE) The GeneXpert MRSA Assay (FDA approved for NASAL specimens only), is one component of a comprehensive MRSA colonization surveillance program. It is not intended to diagnose MRSA infection nor to guide or monitor treatment for MRSA infections. Test performance is not FDA approved in patients less than 42 years old. Performed at [facility], [place] [address] [place], [place] [address]   SARS Coronavirus 2 by RT PCR (hospital order, performed in [facility] hospital lab) *cepheid single result test* Anterior Nasal Swab     Status: None   Collection Time: 09/05/21  2:50 PM   Specimen: Anterior Nasal Swab  Result Value Ref Range Status   SARS Coronavirus 2 by RT PCR NEGATIVE NEGATIVE Final    Comment: (NOTE) SARS-CoV-2 target nucleic acids are NOT DETECTED.  The SARS-CoV-2 RNA is generally detectable in upper and lower respiratory specimens during the acute phase of infection. The lowest concentration of SARS-CoV-2 viral copies this assay can detect is 250 copies / mL. A negative result does not preclude SARS-CoV-2 infection and should not be used as the sole basis for treatment or other patient management decisions.  A negative result may occur with improper specimen collection / handling, submission of specimen other than nasopharyngeal swab, presence of viral mutation(s) within the areas targeted by this assay, and  inadequate number of viral copies (<250 copies / mL). A negative result must be combined with clinical observations, patient history, and epidemiological information.  Fact Sheet for Patients:   https://www.patel.info/  Fact Sheet for Healthcare Providers: https://hall.com/  This test is not yet approved or  cleared by the [place] FDA and has been authorized for detection and/or diagnosis of SARS-CoV-2 by FDA under an Emergency Use Authorization (EUA).  This EUA will remain in effect (meaning this test can be used) for the duration of the COVID-19 declaration under Section 564(b)(1) of the Act, 21 U.S.C. section 360bbb-3(b)(1), unless the authorization is terminated or revoked sooner.  Performed at [facility], [place] [address] [place], [place] [address]     Labs: CBC: Recent Labs  Lab 09/04/21 1311 09/05/21 0306  WBC 8.7 7.9  HGB 13.8 12.2*  HCT 41.4 37.9*  MCV 98.6 101.3*  PLT 229 211   Basic Metabolic Panel: Recent Labs  Lab 09/04/21 1311 09/05/21 0306  NA 142 141  K 3.5 4.2  CL 107 108  CO2 27 27  GLUCOSE 106* 93  BUN 24* 23  CREATININE 1.39* 1.14  CALCIUM 8.6* 8.2*   Liver Function Tests: Recent Labs  Lab 09/04/21 1311 09/05/21 0306  AST 18 20  ALT 15 14  ALKPHOS 37* 35*  BILITOT  0.7 0.9  PROT 7.0 5.6*  ALBUMIN 4.1 3.2*   CBG: Recent Labs  Lab 09/04/21 1319  GLUCAP 99    Discharge time spent: greater than 30 minutes.  Signed: Murray Hodgkins, MD [facility] 09/05/2021

## 2021-09-05 NOTE — Consult Note (Cosign Needed Addendum)
  71 year old male with past medical history of prostate cancer, sleep apnea stable on CPAP, depression, and anxiety.  Who presented to [facility] emergency department after suicide attempt with polypharmacy overdose.  Patient was observed, and is now medically clear.  Due to recent suicide attempt of high lethality, patient will require inpatient psychiatric hospitalization at this time.  Pt was accepted to [facility] 09/05/2021 after 2000; Bed Assignment L28   Pt meets inpatient criteria per Sheran Fava, NP   Attending Physician will be Dr. Louis Meckel   DX:MDD   Report can be called to: - Geropsychiatric unit:  [phone]   Pt can arrive after 2000   Please fax Vol paperwork to [phone] have both before the patient can arrive at [facility].    Care team communication made with the following: Caren Griffins, MD; Alethia Berthold, MD; Caren Macadam, LCSW; Luanna Salk RN; Sheran Fava, NP.

## 2021-09-05 NOTE — Progress Notes (Signed)
Report was given to RN at geropsychiatric unit at [place]. Patient is updated. PTAR was set up.

## 2021-09-06 DIAGNOSIS — F331 Major depressive disorder, recurrent, moderate: Secondary | ICD-10-CM

## 2021-09-06 MED ORDER — SIMVASTATIN 20 MG PO TABS
40.0000 mg | ORAL_TABLET | Freq: Every day | ORAL | Status: DC
  Administered 2021-09-06 – 2021-09-08 (×3): 40 mg via ORAL
  Filled 2021-09-06 (×3): qty 2

## 2021-09-06 MED ORDER — CLONIDINE HCL 0.1 MG PO TABS
0.1000 mg | ORAL_TABLET | Freq: Three times a day (TID) | ORAL | Status: DC | PRN
  Administered 2021-09-08: 0.1 mg via ORAL
  Filled 2021-09-06 (×2): qty 1

## 2021-09-06 MED ORDER — CLONAZEPAM 0.5 MG PO TABS
0.5000 mg | ORAL_TABLET | Freq: Three times a day (TID) | ORAL | Status: DC | PRN
Start: 2021-09-06 — End: 2021-09-09
  Administered 2021-09-06 – 2021-09-07 (×2): 0.5 mg via ORAL
  Filled 2021-09-06 (×3): qty 1

## 2021-09-06 MED ORDER — MELOXICAM 7.5 MG PO TABS
15.0000 mg | ORAL_TABLET | Freq: Every day | ORAL | Status: DC
  Administered 2021-09-06 – 2021-09-09 (×4): 15 mg via ORAL
  Filled 2021-09-06 (×4): qty 2

## 2021-09-06 MED ORDER — DULOXETINE HCL 30 MG PO CPEP
30.0000 mg | ORAL_CAPSULE | Freq: Every day | ORAL | Status: DC
  Administered 2021-09-06 – 2021-09-09 (×4): 30 mg via ORAL
  Filled 2021-09-06 (×4): qty 1

## 2021-09-06 NOTE — BHH Counselor (Signed)
Adult Comprehensive Assessment  Patient ID: Randall Hanson, male   DOB: Jan 26, 1950, 71 y.o.   MRN: [id number]  Information Source: Information source: Patient  Current Stressors:  Patient states their primary concerns and needs for treatment are:: During assesment, patient states he got into an argument with his spouse. As a result patient states that he took an unknown quantity of pills and went to sleep as he was dreading the following day. Patient states their goals for this hospitilization and ongoing recovery are:: Patient states his goal for hospitalization is to "go outside and sit some in the sunshine." Educational / Learning stressors: none reported Employment / Job issues: patient is retired, states retirment can be lonely Family Relationships: none reported Museum/gallery curator / Lack of resources (include bankruptcy): none reported Housing / Lack of housing: none reported Physical health (include injuries & life threatening diseases): none reported Social relationships: states that he has become more lonely since retiring Substance abuse: none reported Bereavement / Loss: reports suicide of friend roughly 1 year ago  Living/Environment/Situation:  Living Arrangements: Spouse/significant other Living conditions (as described by patient or guardian): WNL Who else lives in the home?: pt lives with spouse How long has patient lived in current situation?: 9 years  Family History:  Marital status: Married Number of Years Married: 47 Divorced, when?: 1 prior Curator What is your sexual orientation?: heterosexual Does patient have children?: Yes How many children?: 2 How is patient's relationship with their children?: patient states he sees his son often, has good relationship with both children  Childhood History:  By whom was/is the patient raised?: Both parents Description of patient's relationship with caregiver when they were a child: reports getting along well with  parents. Patient's description of current relationship with people who raised him/her: both parents are deceased Does patient have siblings?: Yes Number of Siblings: 1 Description of patient's current relationship with siblings: states he was cut out of parents estate by sister, has an estranged relationship Did patient suffer any verbal/emotional/physical/sexual abuse as a child?: No Did patient suffer from severe childhood neglect?: No Has patient ever been sexually abused/assaulted/raped as an adolescent or adult?: No Was the patient ever a victim of a crime or a disaster?: No Witnessed domestic violence?: No Has patient been affected by domestic violence as an adult?: No  Education:  Highest grade of school patient has completed: HS Diploma; BA from [place] Currently a student?: No Learning disability?: No  Employment/Work Situation:   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 Type of faith/religion: Christian, baptist How does patient's faith help to cope with current illness?: none reported  Leisure/Recreation:   Do You Have Hobbies?: No  Strengths/Needs:   Patient states these barriers may affect/interfere with their treatment: none reported Patient states these barriers may affect their return to the community: none reported Other important information patient would like considered in planning for their treatment: none  reported  Discharge Plan:   Currently receiving community mental health services: No Does patient have access to  transportation?: Yes Does patient have financial barriers related to discharge medications?: No Will patient be returning to same living situation after discharge?: Yes  Summary/Recommendations:   71 y/o male w/ dx of MDD, recurrent severe, w/ out psychotic features from Randall Hanson w/ [employer] admitted following polypharmacy overdose. 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  During assesment, patient states he got into an argument with his spouse. As a result patient states that he took an unknown quantity of pills and went to sleep as he was dreading the following day. Patient recently retired, lacks routine and social interactions. Acknowledges increased feelings of isolation since retirement. Patient was found unresponsive at home w/ firearm in bed. Per collateral from spouse, patient typically does sleep near firearm for personal safety for the past nine years since moving in to current home.   Per patient and collateral call from spouse, patient has no prior hx of suicide attempts. Though patient appears to be minimizing symptoms of depression.    Patient presents as calm, cooperative, and polite. No evidence of memory or concentration impairment. Appearance is relatively WNL. Patient exhibit mild weakness when ambulating. Speech and content are WNL. Patient oriented to person, place, time, and situation. Patient currently denies SI/HI/ACH  Therapeutic recommendations include further crisis stabilization, medication management, group therapy, and case management. Patient has signed consent for CSW to reach spouse and to share medical records. Sees a family practice physician with [facility] in [place] [employer].  Randall Hanson. 09/06/2021

## 2021-09-06 NOTE — Progress Notes (Signed)
Pt A & O to self, place and situation. Presents guarded with flat affect, depressed mood and logical, soft speech. Denies SI, HI and AVH when assessed. Rates his back pain 3/10 "It's my regular arthritis pain, I'm fine with it right now". Noted to be tearful when asked about anxiety and depression "I will be fine, my wife is coming later today". Visible in milieu at intervals during shift. Minimal but pleasant on interactions. Vitals done, BP remains elevated but he's asymptomatic thus far. Dr. Louis Meckel made aware, new order received for PRN Clonidine for BP >160/90. Pt made aware. Emotional support, reassurance and encouragement provided to pt. Safety checks maintained at Q 15 minutes intervals without outburst or self harm gestures. Verbal education provided on all medications and effects monitored.  Pt tolerates all meals and medications well. Cooperative with care. Denies concerns at this time. Safety maintained.

## 2021-09-06 NOTE — H&P (Signed)
Psychiatric Admission Assessment Adult  Patient Identification: Randall Hanson MRN:  [id number] Date of Evaluation:  09/06/2021 Chief Complaint:  MDD (major depressive disorder), recurrent episode, moderate (Randall Hanson) [F33.1] Principal Diagnosis: MDD (major depressive disorder), recurrent episode, moderate (Randall Hanson) Diagnosis:  Principal Problem:   MDD (major depressive disorder), recurrent episode, moderate (Randall Hanson)  History of Present Illness:  Randall Hanson is a 71 year old white male.  He is a voluntary admission from [facility] after taking an overdose of Klonopin, Cymbalta, and Ambien.  He states that he got in an argument with his wife about moving some objects and thought that it was going to escalate.  He impulsively took the medications because he did not want to continue with the argument.  He states that he has never done this before.  He states he is not suicidal and did this impulsively.  He does take Cymbalta and Mobic for chronic back pain due to L3-L4-L5 spondylolisthesis.  He does not have any psychiatric history.  He has never seen a psychiatrist or been psychiatrically hospitalized.  Chart review states that he does have depression but he does not think that he is depressed.  He reviewed tired as a truck driver about 6-0/1 years ago.  He is living in [place] with his wife.  He has 2 kids a daughter age 41 who works at [employer] as a Pharmacist, community and a son that lives close by.  He states in general things are going well at home.  He has a history of prostate cancer, hypertension, hypercholesterolemia, spondylolisthesis, and insomnia.  PER INITIAL INTAKE: 71 year old man PMH including prostate cancer, sleep apnea, depressed mood, presented to the emergency department after suicide attempt with polypharmacy overdose.  Plan for medical observation and coordination of care with poison control.   Limited history obtained from the patient he is mildly encephalopathic.  He reports an argument with his  wife last evening, after which he wanted to "end it all".  When questioned, he admits that he wanted to kill himself.  He reports taking multiple prescribed medications.  No Tylenol or NSAIDs.  Per documentation this was duloxetine, tramadol, clonazepam (takes twice a day every day), Ambien.  Unclear how much of each.  Also left a suicide note.   At the moment he feels poorly but complaints are nonspecific.   Further information obtained from wife at bedside.  Patient has chronic back pain and difficulty walking.  He performs a lot of tasks for his in-laws as well as around the house.  He has been depressed at this time of year as he seems to lack purpose in her review.  She tries to give him work to do to help him.  He also is jealous of her son and her parents and apparently his sister took his inheritance.   Documentation does note there was a gun at the bedside, wife reports that this is [place] and there is always a gun in the bedroom.  This was not a little per her.  Associated Signs/Symptoms: Depression Symptoms:  depressed mood, insomnia, anxiety, Duration of Depression Symptoms: No data recorded (Hypo) Manic Symptoms:  Irritable Mood, Anxiety Symptoms:  Excessive Worry, Social Anxiety, Psychotic Symptoms:   None PTSD Symptoms: NA Total Time spent with patient: 1 hour  Past Psychiatric History: None  Is the patient at risk to self? Yes.    Has the patient been a risk to self in the past 6 months? Yes.  Has the patient been a risk to self within the distant past? No.  Is the patient a risk to others? No.  Has the patient been a risk to others in the past 6 months? No.  Has the patient been a risk to others within the distant past? No.   [place] Scale:  [place] Admission (Current) from 09/05/2021 in [facility] to Hosp-Admission (Discharged) from 09/04/2021 in [facility]  from 09/02/2020 in [facility] [place] High Risk High Risk No Risk        Prior Inpatient Therapy:   Prior Outpatient Therapy:    Alcohol Screening: 1. How often do you have a drink containing alcohol?: Never 2. How many drinks containing alcohol do you have on a typical day when you are drinking?: 1 or 2 3. How often do you have six or more drinks on one occasion?: Never AUDIT-C Score: 0 4. How often during the last year have you found that you were not able to stop drinking once you had started?: Never 5. How often during the last year have you failed to do what was normally expected from you because of drinking?: Never 6. How often during the last year have you needed a first drink in the morning to get yourself going after a heavy drinking session?: Never 7. How often during the last year have you had a feeling of guilt of remorse after drinking?: Never 8. How often during the last year have you been unable to remember what happened the night before because you had been drinking?: Never 9. Have you or someone else been injured as a result of your drinking?: No 10. Has a relative or friend or a doctor or another health worker been concerned about your drinking or suggested you cut down?: No Alcohol Use Disorder Identification Test Final Score (AUDIT): 0 Substance Abuse History in the last 12 months:  No. Consequences of Substance Abuse: NA Previous Psychotropic Medications: Yes  Psychological Evaluations: No  Past Medical History:  Past Medical History:  Diagnosis Date   Arthritis    Colon polyps    High cholesterol    Hypertension    Joint pain    Prostate cancer ([place])    Sleep apnea     Past Surgical History:  Procedure Laterality Date   LASIK Bilateral    PROSTATECTOMY     REPLACEMENT TOTAL KNEE     SHOULDER SURGERY     Family History:  Family History  Problem Relation Age of Onset   Cancer Father    Family Psychiatric   History: Unremarkable Tobacco Screening:   Social History:  Social History   Substance and Sexual Activity  Alcohol Use Yes   Alcohol/week: 1.0 standard drink of alcohol   Types: 1 Standard drinks or equivalent per week   Comment: per week     Social History   Substance and Sexual Activity  Drug Use Never    Additional Social History:                           Allergies:   Allergies  Allergen Reactions   Mirtazapine     "Made me feel awful"   Trazodone And Nefazodone     Vision blurry/eyes water   Lab Results:  Results for orders placed or performed during the hospital encounter of 09/04/21 (from the past  48 hour(s))  Comprehensive metabolic panel     Status: Abnormal   Collection Time: 09/04/21  1:11 PM  Result Value Ref Range   Sodium 142 135 - 145 mmol/L   Potassium 3.5 3.5 - 5.1 mmol/L   Chloride 107 98 - 111 mmol/L   CO2 27 22 - 32 mmol/L   Glucose, Bld 106 (H) 70 - 99 mg/dL    Comment: Glucose reference range applies only to samples taken after fasting for at least 8 hours.   BUN 24 (H) 8 - 23 mg/dL   Creatinine, Ser 1.39 (H) 0.61 - 1.24 mg/dL   Calcium 8.6 (L) 8.9 - 10.3 mg/dL   Total Protein 7.0 6.5 - 8.1 g/dL   Albumin 4.1 3.5 - 5.0 g/dL   AST 18 15 - 41 U/L   ALT 15 0 - 44 U/L   Alkaline Phosphatase 37 (L) 38 - 126 U/L   Total Bilirubin 0.7 0.3 - 1.2 mg/dL   GFR, Estimated 55 (L) >60 mL/min    Comment: (NOTE) Calculated using the CKD-EPI Creatinine Equation (2021)    Anion gap 8 5 - 15    Comment: Performed at [facility], [place] [address] [place], [place] [address]  cbc     Status: Abnormal   Collection Time: 09/04/21  1:11 PM  Result Value Ref Range   WBC 8.7 4.0 - 10.5 K/uL   RBC 4.20 (L) 4.22 - 5.81 MIL/uL   Hemoglobin 13.8 13.0 - 17.0 g/dL   HCT 41.4 39.0 - 52.0 %   MCV 98.6 80.0 - 100.0 fL   MCH 32.9 26.0 - 34.0 pg   MCHC 33.3 30.0 - 36.0 g/dL   RDW 13.1 11.5 - 15.5 %   Platelets 229 150 - 400 K/uL   nRBC  0.0 0.0 - 0.2 %    Comment: Performed at [facility], [place] [address] [place], [place] [address]  Ethanol     Status: None   Collection Time: 09/04/21  1:13 PM  Result Value Ref Range   Alcohol, Ethyl (B) <10 <10 mg/dL    Comment: (NOTE) Lowest detectable limit for serum alcohol is 10 mg/dL.  For medical purposes only. Performed at [facility], [place] [address] [place], [place] [address]   Salicylate level     Status: Abnormal   Collection Time: 09/04/21  1:13 PM  Result Value Ref Range   Salicylate Lvl <0.7 (L) 7.0 - 30.0 mg/dL    Comment: Performed at [facility], [place] [address] [place], [place] [address]  Acetaminophen level     Status: Abnormal   Collection Time: 09/04/21  1:13 PM  Result Value Ref Range   Acetaminophen (Tylenol), Serum <10 (L) 10 - 30 ug/mL    Comment: (NOTE) Therapeutic concentrations vary significantly. A range of 10-30 ug/mL  may be an effective concentration for many patients. However, some  are best treated at concentrations outside of this range. Acetaminophen concentrations >150 ug/mL at 4 hours after ingestion  and >50 ug/mL at 12 hours after ingestion are often associated with  toxic reactions.  Performed at [facility], [place] [address], [place], [place] [address]   CBG monitoring, ED     Status: None   Collection Time: 09/04/21  1:19 PM  Result Value Ref Range   Glucose-Capillary 99 70 - 99 mg/dL    Comment: Glucose reference range applies only to samples taken after fasting for at least 8  hours.  MRSA Next Gen by PCR, Nasal     Status: None   Collection Time: 09/04/21  6:43 PM   Specimen: Nasal Mucosa; Nasal Swab  Result Value Ref Range   MRSA by PCR Next Gen NOT DETECTED NOT DETECTED    Comment: (NOTE) The GeneXpert MRSA Assay (FDA approved for NASAL specimens only), is one component of a comprehensive MRSA colonization surveillance program. It is not intended  to diagnose MRSA infection nor to guide or monitor treatment for MRSA infections. Test performance is not FDA approved in patients less than 39 years old. Performed at [facility], [place] [address] [place], [place] [address]   Acetaminophen level     Status: Abnormal   Collection Time: 09/04/21  7:07 PM  Result Value Ref Range   Acetaminophen (Tylenol), Serum <10 (L) 10 - 30 ug/mL    Comment: (NOTE) Therapeutic concentrations vary significantly. A range of 10-30 ug/mL  may be an effective concentration for many patients. However, some  are best treated at concentrations outside of this range. Acetaminophen concentrations >150 ug/mL at 4 hours after ingestion  and >50 ug/mL at 12 hours after ingestion are often associated with  toxic reactions.  Performed at [facility], [place] [address] [place], [place] [address]   HIV Antibody (routine testing w rflx)     Status: None   Collection Time: 09/05/21  3:06 AM  Result Value Ref Range   HIV Screen 4th Generation wRfx Non Reactive Non Reactive    Comment: Performed at [facility], [place] [address] [place], [place] [address]  Comprehensive metabolic panel     Status: Abnormal   Collection Time: 09/05/21  3:06 AM  Result Value Ref Range   Sodium 141 135 - 145 mmol/L   Potassium 4.2 3.5 - 5.1 mmol/L    Comment: DELTA CHECK NOTED   Chloride 108 98 - 111 mmol/L   CO2 27 22 - 32 mmol/L   Glucose, Bld 93 70 - 99 mg/dL    Comment: Glucose reference range applies only to samples taken after fasting for at least 8 hours.   BUN 23 8 - 23 mg/dL   Creatinine, Ser 1.14 0.61 - 1.24 mg/dL   Calcium 8.2 (L) 8.9 - 10.3 mg/dL   Total Protein 5.6 (L) 6.5 - 8.1 g/dL   Albumin 3.2 (L) 3.5 - 5.0 g/dL   AST 20 15 - 41 U/L   ALT 14 0 - 44 U/L   Alkaline Phosphatase 35 (L) 38 - 126 U/L   Total Bilirubin 0.9 0.3 - 1.2 mg/dL   GFR, Estimated >60 >60 mL/min    Comment: (NOTE) Calculated using the CKD-EPI  Creatinine Equation (2021)    Anion gap 6 5 - 15    Comment: Performed at [facility], [place] [address], [place], [place] [address]  CBC     Status: Abnormal   Collection Time: 09/05/21  3:06 AM  Result Value Ref Range   WBC 7.9 4.0 - 10.5 K/uL   RBC 3.74 (L) 4.22 - 5.81 MIL/uL   Hemoglobin 12.2 (L) 13.0 - 17.0 g/dL   HCT 37.9 (L) 39.0 - 52.0 %   MCV 101.3 (H) 80.0 - 100.0 fL   MCH 32.6 26.0 - 34.0 pg   MCHC 32.2 30.0 - 36.0 g/dL   RDW 13.1 11.5 - 15.5 %   Platelets 192 150 - 400 K/uL   nRBC 0.0 0.0 - 0.2 %  Comment: Performed at [facility], [place] [address], [place], [place] [address]  Rapid urine drug screen (hospital performed)     Status: Abnormal   Collection Time: 09/05/21  8:21 AM  Result Value Ref Range   Opiates NONE DETECTED NONE DETECTED   Cocaine NONE DETECTED NONE DETECTED   Benzodiazepines POSITIVE (A) NONE DETECTED   Amphetamines NONE DETECTED NONE DETECTED   Tetrahydrocannabinol NONE DETECTED NONE DETECTED   Barbiturates NONE DETECTED NONE DETECTED    Comment: (NOTE) DRUG SCREEN FOR MEDICAL PURPOSES ONLY.  IF CONFIRMATION IS NEEDED FOR ANY PURPOSE, NOTIFY LAB WITHIN 5 DAYS.  LOWEST DETECTABLE LIMITS FOR URINE DRUG SCREEN Drug Class                     Cutoff (ng/mL) Amphetamine and metabolites    1000 Barbiturate and metabolites    200 Benzodiazepine                 702 Tricyclics and metabolites     300 Opiates and metabolites        300 Cocaine and metabolites        300 THC                            50 Performed at [facility], [place] [address] [place], [place] [address]   SARS Coronavirus 2 by RT PCR (hospital order, performed in [facility] hospital lab) *cepheid single result test* Anterior Nasal Swab     Status: None   Collection Time: 09/05/21  2:50 PM   Specimen: Anterior Nasal Swab  Result Value Ref Range   SARS Coronavirus 2 by RT PCR NEGATIVE NEGATIVE    Comment:  (NOTE) SARS-CoV-2 target nucleic acids are NOT DETECTED.  The SARS-CoV-2 RNA is generally detectable in upper and lower respiratory specimens during the acute phase of infection. The lowest concentration of SARS-CoV-2 viral copies this assay can detect is 250 copies / mL. A negative result does not preclude SARS-CoV-2 infection and should not be used as the sole basis for treatment or other patient management decisions.  A negative result may occur with improper specimen collection / handling, submission of specimen other than nasopharyngeal swab, presence of viral mutation(s) within the areas targeted by this assay, and inadequate number of viral copies (<250 copies / mL). A negative result must be combined with clinical observations, patient history, and epidemiological information.  Fact Sheet for Patients:   https://www.patel.info/  Fact Sheet for Healthcare Providers: https://hall.com/  This test is not yet approved or  cleared by the [place] FDA and has been authorized for detection and/or diagnosis of SARS-CoV-2 by FDA under an Emergency Use Authorization (EUA).  This EUA will remain in effect (meaning this test can be used) for the duration of the COVID-19 declaration under Section 564(b)(1) of the Act, 21 U.S.C. section 360bbb-3(b)(1), unless the authorization is terminated or revoked sooner.  Performed at [facility], [place] [address], [place], [place] [address]     Blood Alcohol level:  Lab Results  Component Value Date   ETH <10 77/41/2878    Metabolic Disorder Labs:  No results found for: "HGBA1C", "MPG" No results found for: "PROLACTIN" Lab Results  Component Value Date   CHOL 131 12/21/2020   TRIG 68 12/21/2020   HDL 41 12/21/2020   CHOLHDL 3.2 12/21/2020   LDLCALC 76 12/21/2020    Current Medications: Current  Facility-Administered Medications  Medication Dose Route Frequency  Provider Last Rate Last Admin   acetaminophen (TYLENOL) tablet 650 mg  650 mg Oral Q6H PRN Caroline Sauger, NP       alum & mag hydroxide-simeth (MAALOX/MYLANTA) 200-200-20 MG/5ML suspension 30 mL  30 mL Oral Q4H PRN Caroline Sauger, NP       clonazePAM Bobbye [place]) tablet 0.5 mg  0.5 mg Oral TID PRN Parks Ranger, DO       DULoxetine (CYMBALTA) DR capsule 30 mg  30 mg Oral QPC breakfast Parks Ranger, DO       lisinopril (ZESTRIL) tablet 20 mg  20 mg Oral Daily Caroline Sauger, NP       magnesium hydroxide (MILK OF MAGNESIA) suspension 30 mL  30 mL Oral Daily PRN Caroline Sauger, NP       meloxicam (MOBIC) tablet 15 mg  15 mg Oral QPC breakfast Parks Ranger, DO       simvastatin (ZOCOR) tablet 40 mg  40 mg Oral q1800 Parks Ranger, DO       PTA Medications: Medications Prior to Admission  Medication Sig Dispense Refill Last Dose   clonazePAM (KLONOPIN) 0.5 MG tablet Take 1 tablet (0.5 mg total) by mouth at bedtime as needed (anxiety).      lisinopril (ZESTRIL) 20 MG tablet Take 1 tablet (20 mg total) by mouth daily. 90 tablet 1    meloxicam (MOBIC) 15 MG tablet TAKE ONE TABLET (15 MG) BY MOUTH DAILY 90 tablet 0    simvastatin (ZOCOR) 40 MG tablet Take 40 mg by mouth daily.      vitamin B-12 (CYANOCOBALAMIN) 500 MCG tablet Take 500 mcg by mouth daily.       Musculoskeletal: Strength & Muscle Tone: within normal limits Gait & Station: normal Patient leans: N/A            Psychiatric Specialty Exam:  Presentation  General Appearance: No data recorded Eye Contact:No data recorded Speech:No data recorded Speech Volume:No data recorded Handedness:No data recorded  Mood and Affect  Mood:No data recorded Affect:No data recorded  Thought Process  Thought Processes:No data recorded Duration of Psychotic Symptoms: No data recorded Past Diagnosis of Schizophrenia or Psychoactive disorder: No data recorded Descriptions  of Associations:No data recorded Orientation:No data recorded Thought Content:No data recorded Hallucinations:No data recorded Ideas of Reference:No data recorded Suicidal Thoughts:No data recorded Homicidal Thoughts:No data recorded  Sensorium  Memory:No data recorded Judgment:No data recorded Insight:No data recorded  Executive Functions  Concentration:No data recorded Attention Span:No data recorded Recall:No data recorded Fund of Knowledge:No data recorded Language:No data recorded  Psychomotor Activity  Psychomotor Activity:No data recorded  Assets  Assets:No data recorded  Sleep  Sleep:No data recorded   Physical Exam: Physical Exam Vitals and nursing note reviewed.  Constitutional:      Appearance: Normal appearance. He is normal weight.  HENT:     Head: Normocephalic and atraumatic.     Nose: Nose normal.     Mouth/Throat:     Pharynx: Oropharynx is clear.  Eyes:     Extraocular Movements: Extraocular movements intact.     Pupils: Pupils are equal, round, and reactive to light.  Cardiovascular:     Rate and Rhythm: Normal rate and regular rhythm.     Pulses: Normal pulses.     Heart sounds: Normal heart sounds.  Pulmonary:     Effort: Pulmonary effort is normal.     Breath sounds: Normal breath sounds.  Abdominal:  General: Abdomen is flat. Bowel sounds are normal.     Palpations: Abdomen is soft.  Musculoskeletal:        General: Normal range of motion.     Cervical back: Normal range of motion and neck supple.  Skin:    General: Skin is warm and dry.  Neurological:     General: No focal deficit present.     Mental Status: He is alert and oriented to person, place, and time.  Psychiatric:        Attention and Perception: Attention and perception normal.        Mood and Affect: Mood is depressed. Affect is flat.        Speech: Speech normal.        Behavior: Behavior normal. Behavior is cooperative.        Thought Content: Thought content  normal.        Cognition and Memory: Cognition and memory normal.        Judgment: Judgment is impulsive.    Review of Systems  Constitutional: Negative.   HENT: Negative.    Eyes: Negative.   Respiratory: Negative.    Cardiovascular: Negative.   Gastrointestinal: Negative.   Genitourinary: Negative.   Musculoskeletal: Negative.   Skin: Negative.   Neurological: Negative.   Endo/Heme/Allergies: Negative.   Psychiatric/Behavioral:  Positive for depression. The patient is nervous/anxious and has insomnia.    Blood pressure (!) 158/76, pulse 69, temperature 98.2 F (36.8 C), temperature source Oral, resp. rate 17, height '5\' 10"'$  (1.778 m), weight 99.6 kg, SpO2 100 %. Body mass index is 31.49 kg/m.  Treatment Plan Summary: Daily contact with patient to assess and evaluate symptoms and progress in treatment, Medication management, and Plan restart Cymbalta 30 mg/day.  Restart Klonopin as needed in his medical medications.  Observation Level/Precautions:  15 minute checks  Laboratory:  CBC Chemistry Profile  Psychotherapy:    Medications:    Consultations:    Discharge Concerns:    Estimated LOS:  Other:     Physician Treatment Plan for Primary Diagnosis: MDD (major depressive disorder), recurrent episode, moderate ([place]) Long Term Goal(s): Improvement in symptoms so as ready for discharge  Short Term Goals: Ability to identify changes in lifestyle to reduce recurrence of condition will improve, Ability to verbalize feelings will improve, Ability to disclose and discuss suicidal ideas, Ability to demonstrate self-control will improve, Ability to identify and develop effective coping behaviors will improve, Ability to maintain clinical measurements within normal limits will improve, Compliance with prescribed medications will improve, and Ability to identify triggers associated with substance abuse/mental health issues will improve  Physician Treatment Plan for Secondary Diagnosis:  Principal Problem:   MDD (major depressive disorder), recurrent episode, moderate ([place])    I certify that inpatient services furnished can reasonably be expected to improve the patient's condition.    Parks Ranger, DO 8/22/202310:08 AM

## 2021-09-06 NOTE — Progress Notes (Signed)
Patient is 71 year old man with hx of prostate cancer, sleep apnea, depressed mood who was admitted after suicide attempt with polypharmacy overdose. Patient reports an argument with his wife last evening, after which he wanted to "end it all".  When questioned, patient admits that he wanted to kill himself.  He reports taking multiple prescribed medications duloxetine, tramadol, clonazepam and Ambien. Patient was unclear how much of each he took and had left a suicide note. Patient searched and skin assessment completed. Patient denies SI and HI. Patient oriented to the unit, now in bed appears asleep.

## 2021-09-06 NOTE — Tx Team (Deleted)
Initial Treatment Plan 09/06/2021 12:58 AM Shonte Soderlund Well[place] YNW:[id number]    PATIENT STRESSORS: Financial difficulties   Marital or family conflict     PATIENT STRENGTHS: Ability for insight  Average or above average intelligence  Capable of independent living  Communication skills  Motivation for treatment/growth  Supportive family/friends    PATIENT IDENTIFIED PROBLEMS: SI  Depression                   DISCHARGE CRITERIA:  Improved stabilization in mood, thinking, and/or behavior Verbal commitment to aftercare and medication compliance  PRELIMINARY DISCHARGE PLAN: Participate in family therapy Return to previous living arrangement  PATIENT/FAMILY INVOLVEMENT: This treatment plan has been presented to and reviewed with the patient, Alpha Mysliwiec Genis.  The patient has been given the opportunity to ask questions and make suggestions.  Einar Pheasant, RN 09/06/2021, 12:58 AM

## 2021-09-06 NOTE — [facility] Suicide Risk Assessment (Signed)
[facility] Admission Suicide Risk Assessment   Nursing information obtained from:  Patient Demographic factors:  Male, Age 71 or older, Unemployed Current Mental Status:  NA Loss Factors:  Decrease in vocational status, Financial problems / change in socioeconomic status Historical Factors:  NA Risk Reduction Factors:  Sense of responsibility to family, Living with another person, especially a relative  Total Time spent with patient: 1 hour Principal Problem: MDD (major depressive disorder), recurrent episode, moderate ([place]) Diagnosis:  Principal Problem:   MDD (major depressive disorder), recurrent episode, moderate ([place])  Subjective Data: 71 year old man PMH including prostate cancer, sleep apnea, depressed mood, presented to the emergency department after suicide attempt with polypharmacy overdose.  Plan for medical observation and coordination of care with poison control.   Limited history obtained from the patient he is mildly encephalopathic.  He reports an argument with his wife last evening, after which he wanted to "end it all".  When questioned, he admits that he wanted to kill himself.  He reports taking multiple prescribed medications.  No Tylenol or NSAIDs.  Per documentation this was duloxetine, tramadol, clonazepam (takes twice a day every day), Ambien.  Unclear how much of each.  Also left a suicide note.   At the moment he feels poorly but complaints are nonspecific.   Further information obtained from wife at bedside.  Patient has chronic back pain and difficulty walking.  He performs a lot of tasks for his in-laws as well as around the house.  He has been depressed at this time of year as he seems to lack purpose in her review.  She tries to give him work to do to help him.  He also is jealous of her son and her parents and apparently his sister took his inheritance.   Documentation does note there was a gun at the bedside, wife reports that this [place] and [place] is always a gun in the bedroom.  This was not a little per her.  Continued Clinical Symptoms:  Alcohol Use Disorder Identification Test Final Score (AUDIT): 0 The "Alcohol Use Disorders Identification Test", Guidelines for Use in Primary Care, Second Edition.  World [employer] [facility]). Score between 0-7:  no or low risk or alcohol related problems. Score between 8-15:  moderate risk of alcohol related problems. Score between 16-19:  high risk of alcohol related problems. Score 20 or above:  warrants further diagnostic evaluation for alcohol dependence and treatment.   CLINICAL FACTORS:   Severe Anxiety and/or Agitation Depression:   Anhedonia Chronic Pain   Musculoskeletal: Strength & Muscle Tone: within normal limits Gait & Station: normal Patient leans: N/A  Psychiatric Specialty Exam:  Presentation  General Appearance: No data recorded Eye Contact:No data recorded Speech:No data recorded Speech Volume:No data recorded Handedness:No data recorded  Mood and Affect  Mood:No data recorded Affect:No data recorded  Thought Process  Thought Processes:No data recorded Descriptions of Associations:No data recorded Orientation:No data recorded Thought Content:No data recorded History of Schizophrenia/Schizoaffective disorder:No data recorded Duration of Psychotic Symptoms:No data recorded Hallucinations:No data recorded Ideas of Reference:No data recorded Suicidal Thoughts:No data recorded Homicidal Thoughts:No data recorded  Sensorium  Memory:No data recorded Judgment:No data recorded Insight:No data recorded  Executive Functions  Concentration:No data recorded Attention Span:No data recorded Recall:No data recorded Fund of Knowledge:No data recorded Language:No data recorded  Psychomotor Activity  Psychomotor Activity:No data recorded  Assets  Assets:No data recorded  Sleep  Sleep:No data recorded  Physical Exam: Physical  Exam ROS Blood pressure (!) 158/76, pulse 69, temperature 98.2 F (36.8 C), temperature source Oral, resp. rate 17, height '5\' 10"'$  (1.778 m), weight 99.6 kg, SpO2 100 %. Body mass index is 31.49 kg/m.   COGNITIVE FEATURES THAT CONTRIBUTE TO RISK:  None    SUICIDE RISK:   Mild:  Suicidal ideation of limited frequency, intensity, duration, and specificity.  There are no identifiable plans, no associated intent, mild dysphoria and related symptoms, good self-control (both objective and subjective assessment), few other risk factors, and identifiable protective factors, including available and accessible social support.  PLAN OF CARE: See orders  I certify that inpatient services furnished can reasonably be expected to improve the patient's condition.   Parks Ranger, DO 09/06/2021, 9:58 AM

## 2021-09-06 NOTE — Evaluation (Signed)
Physical Therapy Evaluation Patient Details Name: Randall Hanson MRN: [id number] DOB: 01/29/1950 Today's Date: 09/06/2021  History of Present Illness  Pt is a 71 year old man PMH including prostate cancer, sleep apnea, depressed mood, presented to the emergency department after suicide attempt with polypharmacy overdose.   Clinical Impression  Patient alert, agreeable to PT oriented x4 and denied current pain. Did reference some chronic back pain. Stated at baseline is independent, lives with family.  He was able to perform independent bed mobility and transfers. He ambulated >28f independent (supervision during balance testing for safety) and scored a 23 on the DGI, indicating little to no fall risk. Pt endorsed his mobility is at his baseline except for some minimal shuffling of his feet due to his chronic back pain. The patient demonstrated and reported return to baseline level of functioning, no further acute PT needs indicated. PT to sign off. Please reconsult PT if pt status changes or acute needs are identified.         Recommendations for follow up therapy are one component of a multi-disciplinary discharge planning process, led by the attending physician.  Recommendations may be updated based on patient status, additional functional criteria and insurance authorization.  Follow Up Recommendations No PT follow up      Assistance Recommended at Discharge None  Patient can return home with the following  Other (comment) (N/A)    Equipment Recommendations None recommended by PT  Recommendations for Other Services       Functional Status Assessment Patient has not had a recent decline in their functional status     Precautions / Restrictions Precautions Precautions: Fall Restrictions Weight Bearing Restrictions: No      Mobility  Bed Mobility Overal bed mobility: Independent                  Transfers Overall transfer level: Independent Equipment used:  None                    Ambulation/Gait   Gait Distance (Feet): 200 Feet Assistive device: None         General Gait Details: pt endorsed mild shuffling of gait  Stairs            Wheelchair Mobility    Modified Rankin (Stroke Patients Only)       Balance Overall balance assessment: Independent   Sitting balance-Leahy Scale: Normal       Standing balance-Leahy Scale: Good                   Standardized Balance Assessment Standardized Balance Assessment : Dynamic Gait Index   Dynamic Gait Index Level Surface: Normal Change in Gait Speed: Normal Gait with Horizontal Head Turns: Normal Gait with Vertical Head Turns: Normal Gait and Pivot Turn: Normal Step Over Obstacle: Mild Impairment Step Around Obstacles: Normal Steps: Normal (clinical judgement) Total Score: 23       Pertinent Vitals/Pain Pain Assessment Pain Assessment: No/denies pain    Home Living Family/patient expects to be discharged to:: Private residence Living Arrangements: Spouse/significant other Available Help at Discharge: Family Type of Home: House Home Access: Stairs to enter   ETechnical brewerof Steps: 1   Home Layout: One level;Able to live on main level with bedroom/bathroom Home Equipment: None      Prior Function Prior Level of Function : Independent/Modified Independent                     Hand Dominance  Extremity/Trunk Assessment   Upper Extremity Assessment Upper Extremity Assessment: Overall WFL for tasks assessed    Lower Extremity Assessment Lower Extremity Assessment: Overall WFL for tasks assessed    Cervical / Trunk Assessment Cervical / Trunk Assessment: Normal  Communication      Cognition Arousal/Alertness: Awake/alert Behavior During Therapy: WFL for tasks assessed/performed Overall Cognitive Status: Within Functional Limits for tasks assessed                                           General Comments      Exercises     Assessment/Plan    PT Assessment Patient does not need any further PT services  PT Problem List         PT Treatment Interventions      PT Goals (Current goals can be found in the Care Plan section)       Frequency       Co-evaluation               AM-PAC PT "6 Clicks" Mobility  Outcome Measure Help needed turning from your back to your side while in a flat bed without using bedrails?: None Help needed moving from lying on your back to sitting on the side of a flat bed without using bedrails?: None Help needed moving to and from a bed to a chair (including a wheelchair)?: None Help needed standing up from a chair using your arms (e.g., wheelchair or bedside chair)?: None Help needed to walk in hospital room?: None Help needed climbing 3-5 steps with a railing? : None 6 Click Score: 24    End of Session Equipment Utilized During Treatment: Gait belt Activity Tolerance: Patient tolerated treatment well Patient left: in bed Nurse Communication: Mobility status PT Visit Diagnosis: Other abnormalities of gait and mobility (R26.89)    Time: 8016-5537 PT Time Calculation (min) (ACUTE ONLY): 8 min   Charges:   PT Evaluation $PT Eval Low Complexity: 1 Low          Lieutenant Diego PT, DPT 4:05 PM,09/06/21

## 2021-09-06 NOTE — Progress Notes (Signed)
   09/06/21 2300  Psych Admission Type (Psych Patients Only)  Admission Status Voluntary  Psychosocial Assessment  Patient Complaints Anxiety;Depression;Sadness  Eye Contact Fair  Facial Expression Flat;Sad;Worried  Affect Appropriate to circumstance  Speech Logical/coherent  Interaction Assertive  Motor Activity Fidgety;Restless;Pacing  Appearance/Hygiene Improved;In scrubs  Behavior Characteristics Cooperative  Mood Depressed;Anxious;Pleasant  Aggressive Behavior  Effect No apparent injury  Thought Process  Coherency WDL  Content WDL  Delusions None reported or observed  Perception WDL  Hallucination None reported or observed  Judgment Poor  Confusion None  Danger to Self  Current suicidal ideation? Denies  Danger to Others  Danger to Others None reported or observed

## 2021-09-07 ENCOUNTER — Encounter: Payer: Self-pay | Admitting: General Practice

## 2021-09-07 DIAGNOSIS — F331 Major depressive disorder, recurrent, moderate: Secondary | ICD-10-CM | POA: Diagnosis not present

## 2021-09-07 MED ORDER — TRAZODONE HCL 50 MG PO TABS
50.0000 mg | ORAL_TABLET | Freq: Every day | ORAL | Status: DC
  Administered 2021-09-07: 50 mg via ORAL
  Filled 2021-09-07: qty 1

## 2021-09-07 NOTE — Progress Notes (Signed)
Patient compliant with medications denies SI/HI/A/VH and verbally contracted for safety. No adverse drug noted. Q 15 minutes safety checks ongoing. Patient remains safe.

## 2021-09-07 NOTE — Progress Notes (Signed)
[facility] MD Progress Note  09/07/2021 1:06 PM Randall Hanson  MRN:  [id number] Subjective: Randall Hanson is seen on rounds.  He says that he was up a lot last night having to urinate.  I told him that he does not on any water pill or anything that would contribute to that problem.  He says he was on trazodone from his pain doctor and it was helpful.  It is listed as an allergy we will put him on 50 mg at night.  I put him back on Cymbalta 30 mg/day which I saw in his chart from his pain doctor, also.  Seems to be in a good mood.  He denies any suicidal ideation.  He states he had a good visit with his wife yesterday.  Principal Problem: MDD (major depressive disorder), recurrent episode, moderate ([facility]) Diagnosis: Principal Problem:   MDD (major depressive disorder), recurrent episode, moderate ([facility])  Total Time spent with patient: 15 minutes  Past Psychiatric History: None  Past Medical History:  Past Medical History:  Diagnosis Date   Arthritis    Colon polyps    High cholesterol    Hypertension    Joint pain    Prostate cancer ([place])    Sleep apnea     Past Surgical History:  Procedure Laterality Date   LASIK Bilateral    PROSTATECTOMY     REPLACEMENT TOTAL KNEE     SHOULDER SURGERY     Family History:  Family History  Problem Relation Age of Onset   Cancer Father   Social History:  Social History   Substance and Sexual Activity  Alcohol Use Yes   Comment: 1 standard drink 2x monthly     Social History   Substance and Sexual Activity  Drug Use Never    Social History   Socioeconomic History   Marital status: Married    Spouse name: Not on file   Number of children: Not on file   Years of education: Not on file   Highest education level: Not on file  Occupational History   Not on file  Tobacco Use   Smoking status: Former    Types: Cigarettes   Smokeless tobacco: Never  Vaping Use   Vaping Use: Never used  Substance and Sexual Activity   Alcohol use: Yes     Comment: 1 standard drink 2x monthly   Drug use: Never   Sexual activity: Not Currently    Partners: Female  Other Topics Concern   Not on file  Social History Narrative   Not on file   Social Determinants of Health   Financial Resource Strain: Not on file  Food Insecurity: Not on file  Transportation Needs: Not on file  Physical Activity: Not on file  Stress: Not on file  Social Connections: Not on file   Additional Social History:                         Sleep: Fair  Appetite:  Good  Current Medications: Current Facility-Administered Medications  Medication Dose Route Frequency Provider Last Rate Last Admin   acetaminophen (TYLENOL) tablet 650 mg  650 mg Oral Q6H PRN Caroline Sauger, NP       alum & mag hydroxide-simeth (MAALOX/MYLANTA) 200-200-20 MG/5ML suspension 30 mL  30 mL Oral Q4H PRN Caroline Sauger, NP       clonazePAM Bobbye [place]) tablet 0.5 mg  0.5 mg Oral TID PRN Parks Ranger, DO   0.5 mg  at 09/06/21 1738   cloNIDine (CATAPRES) tablet 0.1 mg  0.1 mg Oral TID PRN Parks Ranger, DO       DULoxetine (CYMBALTA) DR capsule 30 mg  30 mg Oral QPC breakfast Parks Ranger, DO   30 mg at 09/07/21 0804   lisinopril (ZESTRIL) tablet 20 mg  20 mg Oral Daily Caroline Sauger, NP   20 mg at 09/07/21 0805   magnesium hydroxide (MILK OF MAGNESIA) suspension 30 mL  30 mL Oral Daily PRN Caroline Sauger, NP       meloxicam (MOBIC) tablet 15 mg  15 mg Oral QPC breakfast Parks Ranger, DO   15 mg at 09/07/21 0804   simvastatin (ZOCOR) tablet 40 mg  40 mg Oral q1800 Parks Ranger, DO   40 mg at 09/06/21 1738   traZODone (DESYREL) tablet 50 mg  50 mg Oral QHS Parks Ranger, DO        Lab Results:  Results for orders placed or performed during the hospital encounter of 09/04/21 (from the past 48 hour(s))  SARS Coronavirus 2 by RT PCR (hospital order, performed in [facility] hospital lab) *cepheid  single result test* Anterior Nasal Swab     Status: None   Collection Time: 09/05/21  2:50 PM   Specimen: Anterior Nasal Swab  Result Value Ref Range   SARS Coronavirus 2 by RT PCR NEGATIVE NEGATIVE    Comment: (NOTE) SARS-CoV-2 target nucleic acids are NOT DETECTED.  The SARS-CoV-2 RNA is generally detectable in upper and lower respiratory specimens during the acute phase of infection. The lowest concentration of SARS-CoV-2 viral copies this assay can detect is 250 copies / mL. A negative result does not preclude SARS-CoV-2 infection and should not be used as the sole basis for treatment or other patient management decisions.  A negative result may occur with improper specimen collection / handling, submission of specimen other than nasopharyngeal swab, presence of viral mutation(s) within the areas targeted by this assay, and inadequate number of viral copies (<250 copies / mL). A negative result must be combined with clinical observations, patient history, and epidemiological information.  Fact Sheet for Patients:   https://www.patel.info/  Fact Sheet for Healthcare Providers: https://hall.com/  This test is not yet approved or  cleared by the [place] FDA and has been authorized for detection and/or diagnosis of SARS-CoV-2 by FDA under an Emergency Use Authorization (EUA).  This EUA will remain in effect (meaning this test can be used) for the duration of the COVID-19 declaration under Section 564(b)(1) of the Act, 21 U.S.C. section 360bbb-3(b)(1), unless the authorization is terminated or revoked sooner.  Performed at [facility], [place] [address], [place], [place] [address]     Blood Alcohol level:  Lab Results  Component Value Date   ETH <10 73/22/0254    Metabolic Disorder Labs: No results found for: "HGBA1C", "MPG" No results found for: "PROLACTIN" Lab Results  Component Value Date   CHOL 131  12/21/2020   TRIG 68 12/21/2020   HDL 41 12/21/2020   CHOLHDL 3.2 12/21/2020   LDLCALC 76 12/21/2020    Physical Findings: AIMS:  , ,  ,  ,    CIWA:    COWS:     Musculoskeletal: Strength & Muscle Tone: within normal limits Gait & Station: normal Patient leans: N/A  Psychiatric Specialty Exam:  Presentation  General Appearance: No data recorded Eye Contact:No data recorded Speech:No data recorded Speech Volume:No data recorded Handedness:No data  recorded  Mood and Affect  Mood:No data recorded Affect:No data recorded  Thought Process  Thought Processes:No data recorded Descriptions of Associations:No data recorded Orientation:No data recorded Thought Content:No data recorded History of Schizophrenia/Schizoaffective disorder:No data recorded Duration of Psychotic Symptoms:No data recorded Hallucinations:No data recorded Ideas of Reference:No data recorded Suicidal Thoughts:No data recorded Homicidal Thoughts:No data recorded  Sensorium  Memory:No data recorded Judgment:No data recorded Insight:No data recorded  Executive Functions  Concentration:No data recorded Attention Span:No data recorded Recall:No data recorded Fund of Knowledge:No data recorded Language:No data recorded  Psychomotor Activity  Psychomotor Activity:No data recorded  Assets  Assets:No data recorded  Sleep  Sleep:No data recorded   Physical Exam: Physical Exam Vitals and nursing note reviewed.  Constitutional:      Appearance: Normal appearance. He is normal weight.  Neurological:     General: No focal deficit present.     Mental Status: He is alert and oriented to person, place, and time.  Psychiatric:        Attention and Perception: Attention and perception normal.        Mood and Affect: Mood and affect normal.        Speech: Speech normal.        Behavior: Behavior normal. Behavior is cooperative.        Thought Content: Thought content normal.        Cognition and  Memory: Cognition and memory normal.        Judgment: Judgment normal.    Review of Systems  Constitutional: Negative.   HENT: Negative.    Eyes: Negative.   Respiratory: Negative.    Cardiovascular: Negative.   Gastrointestinal: Negative.   Genitourinary: Negative.   Musculoskeletal: Negative.   Skin: Negative.   Neurological: Negative.   Endo/Heme/Allergies: Negative.   Psychiatric/Behavioral:  Positive for depression.    Blood pressure (!) 152/68, pulse 60, temperature 98.4 F (36.9 C), temperature source Oral, resp. rate 16, height '5\' 10"'$  (1.778 m), weight 99.6 kg, SpO2 98 %. Body mass index is 31.49 kg/m.   Treatment Plan Summary: Daily contact with patient to assess and evaluate symptoms and progress in treatment, Medication management, and Plan start trazodone 50 mg at bedtime.  Continue Cymbalta.  Parks Ranger, DO 09/07/2021, 1:06 PM

## 2021-09-07 NOTE — [facility] Suicide Risk Assessment (Signed)
[facility] INPATIENT:  Family/Significant Other Suicide Prevention Education  Suicide Prevention Education:  Education Completed; Randall Hanson, spouse [phone] has been identified by the patient as the family member/significant other with whom the patient will be residing, and identified as the person(s) who will aid the patient in the event of a mental health crisis (suicidal ideations/suicide attempt).  With written consent from the patient, the family member/significant other has been provided the following suicide prevention education, prior to the and/or following the discharge of the patient.  In addition to the precautions below, patient's support agrees to monitor patient for safety, ensure treatment compliance, and alert emergency services should patient require such services.   Patient's spouse has already removed all firearms and mediations from the home.   The suicide prevention education provided includes the following: Suicide risk factors Suicide prevention and interventions National Suicide Hotline telephone number [facility] assessment telephone number [facility] Emergency Assistance [place] and/or Residential Mobile Crisis Unit telephone number  Request made of family/significant other to: Remove weapons (e.g., guns, rifles, knives), all items previously/currently identified as safety concern.   Remove drugs/medications (over-the-counter, prescriptions, illicit drugs), all items previously/currently identified as a safety concern.  The family member/significant other verbalizes understanding of the suicide prevention education information provided.  The family member/significant other agrees to remove the items of safety concern listed above.  Durenda Hurt 09/07/2021, 9:31 AM

## 2021-09-07 NOTE — Plan of Care (Signed)
Patient is alert and oriented times 4. Mood and affect appropriate. Patient denies pain. He denies SI, HI, and AVH. Also denies feelings of anxiety and depression at this time. States he didn't get much sleep due to urinating all night. Morning meds given whole by mouth W/O difficulty. Ate breakfast in day room- appetite good. Patient remains on unit with Q15 minute checks in place.      Problem: Education: Goal: Knowledge of General Education information will improve Description: Including pain rating scale, medication(s)/side effects and non-pharmacologic comfort measures Outcome: Progressing   Problem: Health Behavior/Discharge Planning: Goal: Ability to manage health-related needs will improve Outcome: Progressing   Problem: Clinical Measurements: Goal: Ability to maintain clinical measurements within normal limits will improve Outcome: Progressing Goal: Will remain free from infection Outcome: Progressing Goal: Diagnostic test results will improve Outcome: Progressing Goal: Respiratory complications will improve Outcome: Progressing Goal: Cardiovascular complication will be avoided Outcome: Progressing   Problem: Activity: Goal: Risk for activity intolerance will decrease Outcome: Progressing   Problem: Nutrition: Goal: Adequate nutrition will be maintained Outcome: Progressing   Problem: Coping: Goal: Level of anxiety will decrease Outcome: Progressing   Problem: Elimination: Goal: Will not experience complications related to bowel motility Outcome: Progressing Goal: Will not experience complications related to urinary retention Outcome: Progressing   Problem: Safety: Goal: Ability to remain free from injury will improve Outcome: Progressing   Problem: Pain Managment: Goal: General experience of comfort will improve Outcome: Progressing   Problem: Skin Integrity: Goal: Risk for impaired skin integrity will decrease Outcome: Progressing

## 2021-09-08 DIAGNOSIS — F331 Major depressive disorder, recurrent, moderate: Secondary | ICD-10-CM | POA: Diagnosis not present

## 2021-09-08 MED ORDER — DOXEPIN HCL 50 MG PO CAPS
50.0000 mg | ORAL_CAPSULE | Freq: Every day | ORAL | Status: DC
  Administered 2021-09-08: 50 mg via ORAL
  Filled 2021-09-08: qty 1

## 2021-09-08 NOTE — Plan of Care (Signed)
  Problem: Education: Goal: Knowledge of General Education information will improve Description: Including pain rating scale, medication(s)/side effects and non-pharmacologic comfort measures Outcome: Progressing   Problem: Health Behavior/Discharge Planning: Goal: Ability to manage health-related needs will improve Outcome: Progressing   Problem: Clinical Measurements: Goal: Ability to maintain clinical measurements within normal limits will improve Outcome: Progressing   Problem: Activity: Goal: Risk for activity intolerance will decrease Outcome: Progressing   Problem: Coping: Goal: Level of anxiety will decrease Outcome: Progressing  Patient interacting well with Peers and Staff compliant with treatment plan. Denies SI/HI/A/VH and verbally contracted for safety.   Support and encouragement provided.

## 2021-09-08 NOTE — Progress Notes (Signed)
[facility] MD Progress Note  09/08/2021 10:09 AM Garald Hanson  MRN:  [id number] Subjective: Randall Hanson is seen on rounds.  He said he did not sleep very well.  He remembers that he does not like trazodone and thought that it was tramadol.  Talk to him about doxepin and he was interested in trying it.  He has been on Ambien which sometimes helps but not consistently.  He continues on Cymbalta.  He has had some good visits with his wife and we plan on discharge tomorrow.  Principal Problem: MDD (major depressive disorder), recurrent episode, moderate (HCC) Diagnosis: Principal Problem:   MDD (major depressive disorder), recurrent episode, moderate (HCC)  Total Time spent with patient: 15 minutes  Past Psychiatric History: None  Past Medical History:  Past Medical History:  Diagnosis Date   Arthritis    Colon polyps    High cholesterol    Hypertension    Joint pain    Prostate cancer ([place])    Sleep apnea     Past Surgical History:  Procedure Laterality Date   LASIK Bilateral    PROSTATECTOMY     REPLACEMENT TOTAL KNEE     SHOULDER SURGERY     Family History:  Family History  Problem Relation Age of Onset   Cancer Father     Social History:  Social History   Substance and Sexual Activity  Alcohol Use Yes   Comment: 1 standard drink 2x monthly     Social History   Substance and Sexual Activity  Drug Use Never    Social History   Socioeconomic History   Marital status: Married    Spouse name: Not on file   Number of children: Not on file   Years of education: Not on file   Highest education level: Not on file  Occupational History   Not on file  Tobacco Use   Smoking status: Former    Types: Cigarettes   Smokeless tobacco: Never  Vaping Use   Vaping Use: Never used  Substance and Sexual Activity   Alcohol use: Yes    Comment: 1 standard drink 2x monthly   Drug use: Never   Sexual activity: Not Currently    Partners: Female  Other Topics Concern   Not on file   Social History Narrative   Not on file   Social Determinants of Health   Financial Resource Strain: Not on file  Food Insecurity: Not on file  Transportation Needs: Not on file  Physical Activity: Not on file  Stress: Not on file  Social Connections: Not on file   Additional Social History:                         Sleep: Good  Appetite:  Good  Current Medications: Current Facility-Administered Medications  Medication Dose Route Frequency Provider Last Rate Last Admin   acetaminophen (TYLENOL) tablet 650 mg  650 mg Oral Q6H PRN Caroline Sauger, NP       alum & mag hydroxide-simeth (MAALOX/MYLANTA) 200-200-20 MG/5ML suspension 30 mL  30 mL Oral Q4H PRN Caroline Sauger, NP       clonazePAM Bobbye [place]) tablet 0.5 mg  0.5 mg Oral TID PRN Parks Ranger, DO   0.5 mg at 09/07/21 2340   cloNIDine (CATAPRES) tablet 0.1 mg  0.1 mg Oral TID PRN Parks Ranger, DO       doxepin (SINEQUAN) capsule 50 mg  50 mg Oral QHS Parks Ranger,  DO       DULoxetine (CYMBALTA) DR capsule 30 mg  30 mg Oral QPC breakfast Parks Ranger, DO   30 mg at 09/08/21 0848   lisinopril (ZESTRIL) tablet 20 mg  20 mg Oral Daily Caroline Sauger, NP   20 mg at 09/08/21 0848   magnesium hydroxide (MILK OF MAGNESIA) suspension 30 mL  30 mL Oral Daily PRN Caroline Sauger, NP       meloxicam (MOBIC) tablet 15 mg  15 mg Oral QPC breakfast Parks Ranger, DO   15 mg at 09/08/21 0848   simvastatin (ZOCOR) tablet 40 mg  40 mg Oral q1800 Parks Ranger, DO   40 mg at 09/07/21 1655    Lab Results: No results found for this or any previous visit (from the past 48 hour(s)).  Blood Alcohol level:  Lab Results  Component Value Date   ETH <10 96/29/5284    Metabolic Disorder Labs: No results found for: "HGBA1C", "MPG" No results found for: "PROLACTIN" Lab Results  Component Value Date   CHOL 131 12/21/2020   TRIG 68 12/21/2020   HDL 41  12/21/2020   CHOLHDL 3.2 12/21/2020   LDLCALC 76 12/21/2020    Physical Findings: AIMS:  , ,  ,  ,    CIWA:    COWS:     Musculoskeletal: Strength & Muscle Tone: within normal limits Gait & Station: normal Patient leans: N/A  Psychiatric Specialty Exam:  Presentation  General Appearance: No data recorded Eye Contact:No data recorded Speech:No data recorded Speech Volume:No data recorded Handedness:No data recorded  Mood and Affect  Mood:No data recorded Affect:No data recorded  Thought Process  Thought Processes:No data recorded Descriptions of Associations:No data recorded Orientation:No data recorded Thought Content:No data recorded History of Schizophrenia/Schizoaffective disorder:No data recorded Duration of Psychotic Symptoms:No data recorded Hallucinations:No data recorded Ideas of Reference:No data recorded Suicidal Thoughts:No data recorded Homicidal Thoughts:No data recorded  Sensorium  Memory:No data recorded Judgment:No data recorded Insight:No data recorded  Executive Functions  Concentration:No data recorded Attention Span:No data recorded Recall:No data recorded Fund of Knowledge:No data recorded Language:No data recorded  Psychomotor Activity  Psychomotor Activity:No data recorded  Assets  Assets:No data recorded  Sleep  Sleep:No data recorded   Physical Exam: Physical Exam Vitals and nursing note reviewed.  Constitutional:      Appearance: Normal appearance. He is normal weight.  Neurological:     General: No focal deficit present.     Mental Status: He is alert and oriented to person, place, and time.  Psychiatric:        Attention and Perception: Attention and perception normal.        Mood and Affect: Mood and affect normal.        Speech: Speech normal.        Behavior: Behavior normal.        Thought Content: Thought content normal.        Cognition and Memory: Cognition and memory normal.    Review of Systems   Constitutional: Negative.   HENT: Negative.    Eyes: Negative.   Respiratory: Negative.    Cardiovascular: Negative.   Gastrointestinal: Negative.   Genitourinary: Negative.   Musculoskeletal: Negative.   Skin: Negative.   Neurological: Negative.   Endo/Heme/Allergies: Negative.   Psychiatric/Behavioral:  The patient has insomnia.    Blood pressure (!) 149/77, pulse 64, temperature 98.1 F (36.7 C), temperature source Oral, resp. rate 18, height '5\' 10"'$  (1.778 m), weight 99.6  kg, SpO2 100 %. Body mass index is 31.49 kg/m.   Treatment Plan Summary: Daily contact with patient to assess and evaluate symptoms and progress in treatment, Medication management, and Plan start doxepin 50 mg at bedtime.  Discontinue trazodone.  Continue Cymbalta.  Discharge tomorrow.  [place], DO 09/08/2021, 10:09 AM

## 2021-09-08 NOTE — BH IP Treatment Plan (Signed)
Interdisciplinary Treatment and Diagnostic Plan Update  09/08/2021 Time of Session: 0830 Randall Hanson MRN: [id number]  Principal Diagnosis: MDD (major depressive disorder), recurrent episode, moderate ([place])  Secondary Diagnoses: Principal Problem:   MDD (major depressive disorder), recurrent episode, moderate (HCC)   Current Medications:  Current Facility-Administered Medications  Medication Dose Route Frequency Provider Last Rate Last Admin   acetaminophen (TYLENOL) tablet 650 mg  650 mg Oral Q6H PRN Caroline Sauger, NP       alum & mag hydroxide-simeth (MAALOX/MYLANTA) 200-200-20 MG/5ML suspension 30 mL  30 mL Oral Q4H PRN Caroline Sauger, NP       clonazePAM Bobbye [place]) tablet 0.5 mg  0.5 mg Oral TID PRN Parks Ranger, DO   0.5 mg at 09/07/21 2340   cloNIDine (CATAPRES) tablet 0.1 mg  0.1 mg Oral TID PRN Parks Ranger, DO       doxepin (SINEQUAN) capsule 50 mg  50 mg Oral QHS Parks Ranger, DO       DULoxetine (CYMBALTA) DR capsule 30 mg  30 mg Oral QPC breakfast Parks Ranger, DO   30 mg at 09/08/21 0848   lisinopril (ZESTRIL) tablet 20 mg  20 mg Oral Daily Caroline Sauger, NP   20 mg at 09/08/21 0848   magnesium hydroxide (MILK OF MAGNESIA) suspension 30 mL  30 mL Oral Daily PRN Caroline Sauger, NP       meloxicam (MOBIC) tablet 15 mg  15 mg Oral QPC breakfast Parks Ranger, DO   15 mg at 09/08/21 0848   simvastatin (ZOCOR) tablet 40 mg  40 mg Oral q1800 Parks Ranger, DO   40 mg at 09/07/21 1655   PTA Medications: Medications Prior to Admission  Medication Sig Dispense Refill Last Dose   clonazePAM (KLONOPIN) 0.5 MG tablet Take 1 tablet (0.5 mg total) by mouth at bedtime as needed (anxiety).      lisinopril (ZESTRIL) 20 MG tablet Take 1 tablet (20 mg total) by mouth daily. 90 tablet 1    meloxicam (MOBIC) 15 MG tablet TAKE ONE TABLET (15 MG) BY MOUTH DAILY 90 tablet 0    simvastatin (ZOCOR) 40 MG  tablet Take 40 mg by mouth daily.      vitamin B-12 (CYANOCOBALAMIN) 500 MCG tablet Take 500 mcg by mouth daily.       Patient Stressors:    Patient Strengths:    Treatment Modalities: Medication Management, Group therapy, Case management,  1 to 1 session with clinician, Psychoeducation, Recreational therapy.   Physician Treatment Plan for Primary Diagnosis: MDD (major depressive disorder), recurrent episode, moderate (HCC) Long Term Goal(s): Improvement in symptoms so as ready for discharge   Short Term Goals: Ability to identify changes in lifestyle to reduce recurrence of condition will improve Ability to verbalize feelings will improve Ability to disclose and discuss suicidal ideas Ability to demonstrate self-control will improve Ability to identify and develop effective coping behaviors will improve Ability to maintain clinical measurements within normal limits will improve Compliance with prescribed medications will improve Ability to identify triggers associated with substance abuse/mental health issues will improve  Medication Management: Evaluate patient's response, side effects, and tolerance of medication regimen.  Therapeutic Interventions: 1 to 1 sessions, Unit Group sessions and Medication administration.  Evaluation of Outcomes: Progressing  Physician Treatment Plan for Secondary Diagnosis: Principal Problem:   MDD (major depressive disorder), recurrent episode, moderate ([place])  Long Term Goal(s): Improvement in symptoms so as ready for discharge   Short Term Goals: Ability  to identify changes in lifestyle to reduce recurrence of condition will improve Ability to verbalize feelings will improve Ability to disclose and discuss suicidal ideas Ability to demonstrate self-control will improve Ability to identify and develop effective coping behaviors will improve Ability to maintain clinical measurements within normal limits will improve Compliance with prescribed  medications will improve Ability to identify triggers associated with substance abuse/mental health issues will improve     Medication Management: Evaluate patient's response, side effects, and tolerance of medication regimen.  Therapeutic Interventions: 1 to 1 sessions, Unit Group sessions and Medication administration.  Evaluation of Outcomes: Progressing   RN Treatment Plan for Primary Diagnosis: MDD (major depressive disorder), recurrent episode, moderate (HCC) Long Term Goal(s): Knowledge of disease and therapeutic regimen to maintain health will improve  Short Term Goals: Ability to remain free from injury will improve, Ability to verbalize frustration and anger appropriately will improve, Ability to demonstrate self-control, Ability to participate in decision making will improve, Ability to verbalize feelings will improve, Ability to disclose and discuss suicidal ideas, Ability to identify and develop effective coping behaviors will improve, and Compliance with prescribed medications will improve  Medication Management: RN will administer medications as ordered by provider, will assess and evaluate patient's response and provide education to patient for prescribed medication. RN will report any adverse and/or side effects to prescribing provider.  Therapeutic Interventions: 1 on 1 counseling sessions, Psychoeducation, Medication administration, Evaluate responses to treatment, Monitor vital signs and CBGs as ordered, Perform/monitor CIWA, COWS, AIMS and Fall Risk screenings as ordered, Perform wound care treatments as ordered.  Evaluation of Outcomes: Progressing   LCSW Treatment Plan for Primary Diagnosis: MDD (major depressive disorder), recurrent episode, moderate ([place]) Long Term Goal(s): Safe transition to appropriate next level of care at discharge, Engage patient in therapeutic group addressing interpersonal concerns.  Short Term Goals: Engage patient in aftercare planning with  referrals and resources, Increase social support, Increase ability to appropriately verbalize feelings, Increase emotional regulation, Facilitate acceptance of mental health diagnosis and concerns, Facilitate patient progression through stages of change regarding substance use diagnoses and concerns, Identify triggers associated with mental health/substance abuse issues, and Increase skills for wellness and recovery  Therapeutic Interventions: Assess for all discharge needs, 1 to 1 time with Social worker, Explore available resources and support systems, Assess for adequacy in community support network, Educate family and significant other(s) on suicide prevention, Complete Psychosocial Assessment, Interpersonal group therapy.  Evaluation of Outcomes: Progressing   Progress in Treatment: Attending groups: Yes. Participating in groups: Yes. Taking medication as prescribed: Yes. Toleration medication: Yes. Family/Significant other contact made: Yes, individual(s) contacted:  Randall Hanson, spouse [phone]  Patient understands diagnosis: Yes. Discussing patient identified problems/goals with staff: Yes. Medical problems stabilized or resolved: Yes. Denies suicidal/homicidal ideation: No. Issues/concerns per patient self-inventory: Yes. Other: none  New problem(s) identified: No, Describe:  none  New Short Term/Long Term Goal(s): Patient to work towards medication management for mood stabilization; elimination of SI thoughts; development of comprehensive mental wellness plan.  Patient Goals: Patient states their goal for treatment is to "go outside and get some sunshine."  Discharge Plan or Barriers: No psychosocial barriers identified at this time, patient to return to place of residence when appropriate for discharge.   Reason for Continuation of Hospitalization: Depression  Estimated Length of Stay: 1-7 days  Scribe for Treatment Team: Larose Kells 09/08/2021 10:36 AM

## 2021-09-08 NOTE — Plan of Care (Signed)
D:  Patient alert and oriented x 4.  Patient reports he feels groggy this morning due to the medication he took last night.  He reports a good appetite.   Patient denies anxiety, depression, SI/HI, AVH and pain.  Patient's only complaint was boredom.  Patient is pleasant with bright affect.    A:  Medications administered as ordered.  Emotional support and encouragement offered.  Frequent verbal interaction with patient.  Q15 minute checks in place for safety.   R:  Patient compliant with medications.  No adverse reactions noted.  Patient contracts for safety. Patient visible in the milieu.      Problem: Education: Goal: Knowledge of General Education information will improve Description: Including pain rating scale, medication(s)/side effects and non-pharmacologic comfort measures Outcome: Progressing   Problem: Health Behavior/Discharge Planning: Goal: Ability to manage health-related needs will improve Outcome: Progressing   Problem: Activity: Goal: Risk for activity intolerance will decrease Outcome: Progressing   Problem: Nutrition: Goal: Adequate nutrition will be maintained Outcome: Progressing   Problem: Coping: Goal: Level of anxiety will decrease Outcome: Progressing

## 2021-09-08 NOTE — Progress Notes (Signed)
Patient shaved with electric razor with this RN present.

## 2021-09-08 NOTE — Progress Notes (Signed)
[facility] Psychoeducational Group Note   Date:  09/08/21 Time:  11:23AM   Group Topic/Focus:  The focus of this group is to help patients review strengths and weaknesses.    Participation Level:  Active   Participation Quality:  Appropriate   Affect:  Appropriate   Cognitive:  Appropriate   Insight:  Appropriate   Engagement in Group:  Engaged   Modes of Intervention:  Discussion and worksheet   Additional Comments:   N/A   Luanna Salk, RN 09/08/21 11:26AM

## 2021-09-08 NOTE — Group Note (Signed)
LCSW Group Therapy Note  Group Date: 09/08/2021 Start Time: 1300 End Time: 1400   Type of Therapy and Topic:  Group Therapy - Healthy vs Unhealthy Coping Skills  Participation Level:  Active   Description of Group The focus of this group was to determine what unhealthy coping techniques typically are used by group members and what healthy coping techniques would be helpful in coping with various problems. Patients were guided in becoming aware of the differences between healthy and unhealthy coping techniques. Patients were asked to identify 2-3 healthy coping skills they would like to learn to use more effectively.  Therapeutic Goals Patients learned that coping is what human beings do all day long to deal with various situations in their lives Patients defined and discussed healthy vs unhealthy coping techniques Patients identified their preferred coping techniques and identified whether these were healthy or unhealthy Patients determined 2-3 healthy coping skills they would like to become more familiar with and use more often. Patients provided support and ideas to each other   Summary of Patient Progress:  During group, patient expressed that he uses walks outside as his primary coping strategy.  Patient proved open to input from peers and feedback from [place]. Patient demonstrated good insight into the subject matter, was respectful of peers, and participated throughout the entire session.   Therapeutic Modalities Cognitive Behavioral Therapy Motivational Interviewing  Maryjane Hurter 09/08/2021  2:34 PM

## 2021-09-08 NOTE — Care Management Important Message (Signed)
Important Message  Patient Details  Name: Randall Hanson MRN: [id number] Date of Birth: [birth year]   Medicare Important Message Given:  Yes     Rozann Lesches, LCSW 09/08/2021, 4:19 PM

## 2021-09-09 MED ORDER — DOXEPIN HCL 25 MG PO CAPS: ORAL_CAPSULE | ORAL | 3 refills | Status: DC

## 2021-09-09 MED ORDER — LISINOPRIL 40 MG PO TABS: 40.0000 mg | ORAL_TABLET | Freq: Every day | ORAL | 3 refills | Status: DC

## 2021-09-09 MED ORDER — LISINOPRIL 20 MG PO TABS: 40.0000 mg | ORAL_TABLET | Freq: Every day | ORAL | Status: DC

## 2021-09-09 MED ORDER — DOXEPIN HCL 25 MG PO CAPS: 25.0000 mg | ORAL_CAPSULE | Freq: Every day | ORAL | Status: DC

## 2021-09-09 MED ORDER — DULOXETINE HCL 30 MG PO CPEP: 30.0000 mg | ORAL_CAPSULE | Freq: Every day | ORAL | 3 refills | Status: DC

## 2021-09-09 NOTE — BHH Suicide Risk Assessment (Signed)
[facility] Discharge Suicide Risk Assessment   Principal Problem: MDD (major depressive disorder), recurrent episode, moderate ([place]) Discharge Diagnoses: Principal Problem:   MDD (major depressive disorder), recurrent episode, moderate ([place])   Total Time spent with patient: 1 hour  Musculoskeletal: Strength & Muscle Tone: within normal limits Gait & Station: normal Patient leans: N/A  Psychiatric Specialty Exam  Presentation  General Appearance: No data recorded Eye Contact:No data recorded Speech:No data recorded Speech Volume:No data recorded Handedness:No data recorded  Mood and Affect  Mood:No data recorded Duration of Depression Symptoms: No data recorded Affect:No data recorded  Thought Process  Thought Processes:No data recorded Descriptions of Associations:No data recorded Orientation:No data recorded Thought Content:No data recorded History of Schizophrenia/Schizoaffective disorder:No data recorded Duration of Psychotic Symptoms:No data recorded Hallucinations:No data recorded Ideas of Reference:No data recorded Suicidal Thoughts:No data recorded Homicidal Thoughts:No data recorded  Sensorium  Memory:No data recorded Judgment:No data recorded Insight:No data recorded  Executive Functions  Concentration:No data recorded Attention Span:No data recorded Recall:No data recorded Fund of Knowledge:No data recorded Language:No data recorded  Psychomotor Activity  Psychomotor Activity:No data recorded  Assets  Assets:No data recorded  Sleep  Sleep:No data recorded   Blood pressure (!) 144/72, pulse 65, temperature 98.5 F (36.9 C), temperature source Oral, resp. rate 18, height '5\' 10"'$  (1.778 m), weight 99.6 kg, SpO2 98 %. Body mass index is 31.49 kg/m.  Mental Status Per Nursing Assessment::   On Admission:  NA  Demographic Factors:  Male, Age 20 or older, and Caucasian  Loss Factors: NA  Historical Factors: NA  Risk Reduction Factors:   Sense  of responsibility to family, Living with another person, especially a relative, Positive social support, Positive therapeutic relationship, and Positive coping skills or problem solving skills  Continued Clinical Symptoms:  Depression:   Anhedonia  Cognitive Features That Contribute To Risk:  None    Suicide Risk:  Minimal: No identifiable suicidal ideation.  Patients presenting with no risk factors but with morbid ruminations; may be classified as minimal risk based on the severity of the depressive symptoms   Follow-up [facility] Follow up.   Specialty: Behavioral Health Why: Medication management appointment is scheduled for 09/26/2021 at 11:00AM with Dr. De Nurse.  Appointment is VIRTUAL. You can go into the "waiting room" on MyChart for your appointment. Contact information: [address] [place] [phone]                Plan Of Care/Follow-up recommendations: [facility], DO 09/09/2021, 10:06 AM

## 2021-09-09 NOTE — Progress Notes (Signed)
Discharge Note:  Patient denies SI/HI/AVH at this time. Discharge instructions, AVS, prescriptions, and transition record reviewed with patient. Patient agrees to comply with medication management, follow-up visit, and outpatient therapy. Patient belongings returned to patient. Patient questions and concerns addressed and answered. Patient ambulatory off unit. Patient discharged to home with wife.

## 2021-09-09 NOTE — Plan of Care (Signed)
  Problem: Education: Goal: Knowledge of General Education information will improve Description: Including pain rating scale, medication(s)/side effects and non-pharmacologic comfort measures 09/09/2021 1119 by Nolon Bussing, RN Outcome: Adequate for Discharge 09/09/2021 1119 by Nolon Bussing, RN Outcome: Progressing   Problem: Health Behavior/Discharge Planning: Goal: Ability to manage health-related needs will improve 09/09/2021 1119 by Nolon Bussing, RN Outcome: Adequate for Discharge 09/09/2021 1119 by Nolon Bussing, RN Outcome: Progressing   Problem: Clinical Measurements: Goal: Ability to maintain clinical measurements within normal limits will improve 09/09/2021 1119 by Nolon Bussing, RN Outcome: Adequate for Discharge 09/09/2021 1119 by Nolon Bussing, RN Outcome: Progressing Goal: Will remain free from infection 09/09/2021 1119 by Nolon Bussing, RN Outcome: Adequate for Discharge 09/09/2021 1119 by Nolon Bussing, RN Outcome: Progressing Goal: Diagnostic test results will improve 09/09/2021 1119 by Nolon Bussing, RN Outcome: Adequate for Discharge 09/09/2021 1119 by Nolon Bussing, RN Outcome: Progressing Goal: Respiratory complications will improve 09/09/2021 1119 by Nolon Bussing, RN Outcome: Adequate for Discharge 09/09/2021 1119 by Nolon Bussing, RN Outcome: Progressing Goal: Cardiovascular complication will be avoided 09/09/2021 1119 by Nolon Bussing, RN Outcome: Adequate for Discharge 09/09/2021 1119 by Nolon Bussing, RN Outcome: Progressing   Problem: Activity: Goal: Risk for activity intolerance will decrease 09/09/2021 1119 by Nolon Bussing, RN Outcome: Adequate for Discharge 09/09/2021 1119 by Nolon Bussing, RN Outcome: Progressing   Problem: Nutrition: Goal: Adequate nutrition will be maintained 09/09/2021 1119 by Nolon Bussing, RN Outcome: Adequate for Discharge 09/09/2021 1119 by  Nolon Bussing, RN Outcome: Progressing   Problem: Coping: Goal: Level of anxiety will decrease 09/09/2021 1119 by Nolon Bussing, RN Outcome: Adequate for Discharge 09/09/2021 1119 by Nolon Bussing, RN Outcome: Progressing   Problem: Elimination: Goal: Will not experience complications related to bowel motility 09/09/2021 1119 by Nolon Bussing, RN Outcome: Adequate for Discharge 09/09/2021 1119 by Nolon Bussing, RN Outcome: Progressing Goal: Will not experience complications related to urinary retention 09/09/2021 1119 by Nolon Bussing, RN Outcome: Adequate for Discharge 09/09/2021 1119 by Nolon Bussing, RN Outcome: Progressing   Problem: Pain Managment: Goal: General experience of comfort will improve 09/09/2021 1119 by Nolon Bussing, RN Outcome: Adequate for Discharge 09/09/2021 1119 by Nolon Bussing, RN Outcome: Progressing   Problem: Safety: Goal: Ability to remain free from injury will improve 09/09/2021 1119 by Nolon Bussing, RN Outcome: Adequate for Discharge 09/09/2021 1119 by Nolon Bussing, RN Outcome: Progressing   Problem: Skin Integrity: Goal: Risk for impaired skin integrity will decrease 09/09/2021 1119 by Nolon Bussing, RN Outcome: Adequate for Discharge 09/09/2021 1119 by Nolon Bussing, RN Outcome: Progressing

## 2021-09-09 NOTE — Progress Notes (Signed)
  [facility] Adult Case Management Discharge Plan :  Will you be returning to the same living situation after discharge:  Yes,  pt reports that he is returning home.  At discharge, do you have transportation home?: Yes,  pt reports that wife will provide transportation.  Do you have the ability to pay for your medications: Yes,  [facility]  Release of information consent forms completed and in the chart;  Patient's signature needed at discharge.  Patient to Follow up at:  Follow-up [facility] Follow up.   Specialty: Behavioral Health Why: Medication management appointment is scheduled for 09/26/2021 at 11:00AM with Dr. De Nurse.  Appointment is VIRTUAL. You can go into the "waiting room" on MyChart for your appointment. Contact information: [place]  [address]                Next level of care provider has access to [facility] and Suicide Prevention discussed: Yes,  completed with the patient and patient's wife.     Has patient been referred to the Quitline?: Patient refused referral  Patient has been referred for addiction treatment: [place], LCSW 09/09/2021, 10:07 AM

## 2021-09-09 NOTE — Discharge Summary (Signed)
Physician Discharge Summary Note  Patient:  Randall Hanson is an 71 y.o., male MRN:  [id number] DOB:  Jan 29, 1950 Patient phone:  [phone] (home)  Patient address:   [address] [place] [address],  Total Time spent with patient: 1 hour  Date of Admission:  09/05/2021 Date of Discharge: 09/09/2021  Reason for Admission:  Danzel is a 71 year old white male.  He is a voluntary admission from [facility] after taking an overdose of Klonopin, Cymbalta, and Ambien.  He states that he got in an argument with his wife about moving some objects and thought that it was going to escalate.  He impulsively took the medications because he did not want to continue with the argument.  He states that he has never done this before.  He states he is not suicidal and did this impulsively.  He does take Cymbalta and Mobic for chronic back pain due to L3-L4-L5 spondylolisthesis.  He does not have any psychiatric history.  He has never seen a psychiatrist or been psychiatrically hospitalized.  Chart review states that he does have depression but he does not think that he is depressed.  He reviewed tired as a truck driver about 7-5/1 years ago.  He is living in [place] with his wife.  He has 2 kids a daughter age 39 who works at [employer] as a Pharmacist, community and a son that lives close by.  He states in general things are going well at home.  He has a history of prostate cancer, hypertension, hypercholesterolemia, spondylolisthesis, and insomnia.  Principal Problem: MDD (major depressive disorder), recurrent episode, moderate ([place]) Discharge Diagnoses: Principal Problem:   MDD (major depressive disorder), recurrent episode, moderate ([facility])   Past Psychiatric History: None  Past Medical History:  Past Medical History:  Diagnosis Date   Arthritis    Colon polyps    High cholesterol    Hypertension    Joint pain    Prostate cancer ([place])    Sleep apnea     Past Surgical History:  Procedure  Laterality Date   LASIK Bilateral    PROSTATECTOMY     REPLACEMENT TOTAL KNEE     SHOULDER SURGERY     Family History:  Family History  Problem Relation Age of Onset   Cancer Father    Family Psychiatric  History: Unremarkable Social History:  Social History   Substance and Sexual Activity  Alcohol Use Yes   Comment: 1 standard drink 2x monthly     Social History   Substance and Sexual Activity  Drug Use Never    Social History   Socioeconomic History   Marital status: Married    Spouse name: Not on file   Number of children: Not on file   Years of education: Not on file   Highest education level: Not on file  Occupational History   Not on file  Tobacco Use   Smoking status: Former    Types: Cigarettes   Smokeless tobacco: Never  Vaping Use   Vaping Use: Never used  Substance and Sexual Activity   Alcohol use: Yes    Comment: 1 standard drink 2x monthly   Drug use: Never   Sexual activity: Not Currently    Partners: Female  Other Topics Concern   Not on file  Social History Narrative   Not on file   Social Determinants of Health   Financial Resource Strain: Not on file  Food Insecurity: Not on file  Transportation Needs: Not on file  Physical Activity: Not on file  Stress: Not on file  Social Connections: Not on file    Hospital Course: [place] was admitted under routine orders and precautions to the geriatric psychiatry unit on a voluntary basis.  He had become involved in an argument with his wife and impulsively took an overdose.  While on the inpatient unit his home medications were restarted including Cymbalta 30 mg for depression and chronic back pain.  Doxepin was added at bedtime which was titrated up to 50 mg but he found that might be a little strong so I told him on discharge he can take 25 mg 1 or 2 tablets at bedtime.  He was pleasant and cooperative throughout his stay.  He was compliant with his medications.  He denied any suicidal ideation.   No side effects from his medicines.  His mood improved and his visits with his wife improved.  It was felt that he maximize hospitalization he was discharged home.  On the day of discharge she denied suicidal ideation, homicidal ideation, auditory or visual hallucinations.  His judgment and insight were good.  Physical Findings: AIMS:  , ,  ,  ,    CIWA:    COWS:     Musculoskeletal: Strength & Muscle Tone: within normal limits Gait & Station: normal Patient leans: N/A   Psychiatric Specialty Exam:  Presentation  General Appearance: No data recorded Eye Contact:No data recorded Speech:No data recorded Speech Volume:No data recorded Handedness:No data recorded  Mood and Affect  Mood:No data recorded Affect:No data recorded  Thought Process  Thought Processes:No data recorded Descriptions of Associations:No data recorded Orientation:No data recorded Thought Content:No data recorded History of Schizophrenia/Schizoaffective disorder:No data recorded Duration of Psychotic Symptoms:No data recorded Hallucinations:No data recorded Ideas of Reference:No data recorded Suicidal Thoughts:No data recorded Homicidal Thoughts:No data recorded  Sensorium  Memory:No data recorded Judgment:No data recorded Insight:No data recorded  Executive Functions  Concentration:No data recorded Attention Span:No data recorded Recall:No data recorded Fund of Knowledge:No data recorded Language:No data recorded  Psychomotor Activity  Psychomotor Activity:No data recorded  Assets  Assets:No data recorded  Sleep  Sleep:No data recorded   Physical Exam: Physical Exam Vitals and nursing note reviewed.  Constitutional:      Appearance: Normal appearance. He is normal weight.  Neurological:     General: No focal deficit present.     Mental Status: He is alert and oriented to person, place, and time.  Psychiatric:        Attention and Perception: Attention and perception normal.         Mood and Affect: Mood and affect normal.        Speech: Speech normal.        Behavior: Behavior normal. Behavior is cooperative.        Thought Content: Thought content normal.        Cognition and Memory: Cognition and memory normal.        Judgment: Judgment normal.    Review of Systems  Constitutional: Negative.   HENT: Negative.    Eyes: Negative.   Respiratory: Negative.    Cardiovascular: Negative.   Gastrointestinal: Negative.   Genitourinary: Negative.   Musculoskeletal: Negative.   Skin: Negative.   Neurological: Negative.   Endo/Heme/Allergies: Negative.   Psychiatric/Behavioral: Negative.     Blood pressure (!) 144/72, pulse 65, temperature 98.5 F (36.9 C), temperature source Oral, resp. rate 18, height '5\' 10"'$  (1.778 m), weight 99.6 kg, SpO2 98 %. Body mass index  is 31.49 kg/m.   Social History   Tobacco Use  Smoking Status Former   Types: Cigarettes  Smokeless Tobacco Never   Tobacco Cessation:  A prescription for an FDA-approved tobacco cessation medication was offered at discharge and the patient refused   Blood Alcohol level:  Lab Results  Component Value Date   ETH <10 48/54/6270    Metabolic Disorder Labs:  No results found for: "HGBA1C", "MPG" No results found for: "PROLACTIN" Lab Results  Component Value Date   CHOL 131 12/21/2020   TRIG 68 12/21/2020   HDL 41 12/21/2020   CHOLHDL 3.2 12/21/2020   [place] 76 12/21/2020    See Psychiatric Specialty Exam and Suicide Risk Assessment completed by Attending Physician prior to discharge.  Discharge destination:  Home  Is patient on multiple antipsychotic therapies at discharge:  No   Has Patient had three or more failed trials of antipsychotic monotherapy by history:  No  Recommended Plan for Multiple Antipsychotic Therapies: NA   Allergies as of 09/09/2021       Reactions   Mirtazapine    "Made me feel awful"   Trazodone And Nefazodone    Vision blurry/eyes water         Medication List     TAKE these medications      Indication  clonazePAM 0.5 MG tablet Commonly known as: KLONOPIN Take 1 tablet (0.5 mg total) by mouth at bedtime as needed (anxiety).    cyanocobalamin 500 MCG tablet Commonly known as: VITAMIN B12 Take 500 mcg by mouth daily.    doxepin 25 MG capsule Commonly known as: SINEQUAN One or two at bedtime.  Indication: Depression   DULoxetine 30 MG capsule Commonly known as: CYMBALTA Take 1 capsule (30 mg total) by mouth daily after breakfast. Start taking on: September 10, 2021  Indication: Major Depressive Disorder, Musculoskeletal Pain   lisinopril 40 MG tablet Commonly known as: ZESTRIL Take 1 tablet (40 mg total) by mouth daily. Start taking on: September 10, 2021 What changed:  medication strength how much to take  Indication: High Blood Pressure Disorder   meloxicam 15 MG tablet Commonly known as: MOBIC TAKE ONE TABLET (15 MG) BY MOUTH DAILY    simvastatin 40 MG tablet Commonly known as: ZOCOR Take 40 mg by mouth daily.         Follow-up [facility] Follow up.   Specialty: Behavioral Health Why: Medication management appointment is scheduled for 09/26/2021 at 11:00AM with Dr. De Nurse.  Appointment is VIRTUAL. You can go into the "waiting room" on MyChart for your appointment. Contact information: [address] [place] [phone]                Follow-up recommendations:  As above.   Signed: Parks Ranger, DO 09/09/2021, 10:26 AM

## 2021-09-09 NOTE — Plan of Care (Signed)
D:  Patient alert and oriented x4.  Patient reports he slept well and had a good appetite this morning.  Patient denies having any anxiety, depression, SI/HI, AVH or pain. Patient is pleasant with staff with bright affect.    Patient expected to discharge today.  A:  Medications administered as ordered.  Emotional support and encouragement offered.  Frequent verbal interaction with patient.  Q15 minute checks in place for safety.   R:  Patient compliant with medications.  No adverse reactions noted.  Patient contracts for safety. Appropriate interaction with peers in the milieu.   Problem: Health Behavior/Discharge Planning: Goal: Ability to manage health-related needs will improve Outcome: Progressing   Problem: Activity: Goal: Risk for activity intolerance will decrease Outcome: Progressing   Problem: Nutrition: Goal: Adequate nutrition will be maintained Outcome: Progressing   Problem: Coping: Goal: Level of anxiety will decrease Outcome: Progressing

## 2021-09-12 ENCOUNTER — Telehealth: Payer: Self-pay | Admitting: General Practice

## 2021-09-12 NOTE — Telephone Encounter (Signed)
Transition Care Management Follow-up Telephone Call Date of discharge and from where: 09/09/21 from [facility] How have you been since you were released from the hospital? Patient stated that he is feeling much better. Any questions or concerns? No  Items Reviewed: Did the pt receive and understand the discharge instructions provided? Yes  Medications obtained and verified? No  Other? No  Any new allergies since your discharge? No  Dietary orders reviewed? Yes Do you have support at home? Yes   Home Care and Equipment/Supplies: Were home health services ordered? no  Functional Questionnaire: (I = Independent and D = Dependent) ADLs: I  Bathing/Dressing- I  Meal Prep- I  Eating- I  Maintaining continence- I  Transferring/Ambulation- I  Managing Meds- I  Follow up appointments reviewed:  PCP Hospital f/u appt confirmed? Yes  Scheduled to see PCP on 09/27/21 @ 9. [facility] f/u appt confirmed? Yes  Scheduled to see the psychiatrist on 09/24/21 . Are transportation arrangements needed? No  If their condition worsens, is the pt aware to call PCP or go to the Emergency Dept.? Yes Was the patient provided with contact information for the PCP's office or ED? Yes Was to pt encouraged to call back with questions or concerns? Yes

## 2021-09-25 ENCOUNTER — Other Ambulatory Visit: Payer: Self-pay | Admitting: Physician Assistant

## 2021-09-26 ENCOUNTER — Ambulatory Visit (INDEPENDENT_AMBULATORY_CARE_PROVIDER_SITE_OTHER): Payer: Medicare Other | Admitting: Psychiatry

## 2021-09-26 ENCOUNTER — Encounter (HOSPITAL_COMMUNITY): Payer: Self-pay | Admitting: Psychiatry

## 2021-09-26 DIAGNOSIS — F411 Generalized anxiety disorder: Secondary | ICD-10-CM | POA: Diagnosis not present

## 2021-09-26 DIAGNOSIS — F331 Major depressive disorder, recurrent, moderate: Secondary | ICD-10-CM | POA: Diagnosis not present

## 2021-09-26 DIAGNOSIS — F5102 Adjustment insomnia: Secondary | ICD-10-CM | POA: Diagnosis not present

## 2021-09-26 NOTE — Progress Notes (Signed)
Psychiatric Initial Adult Assessment   Patient Identification: Josemaria Brining MRN:  [id number] Date of Evaluation:  09/26/2021 Referral Source: hospital discharge  Chief Complaint:   Chief Complaint  Patient presents with   Establish Care   Depression   Patient Education   Visit Diagnosis:    ICD-10-CM   1. MDD (major depressive disorder), recurrent episode, moderate (HCC)  F33.1     2. GAD (generalized anxiety disorder)  F41.1     3. Adjustment insomnia  F51.02     Virtual Visit via Video Note  I connected with Layton Naves Botello on 09/26/21 at 11:00 AM EDT by a video enabled telemedicine application and verified that I am speaking with the correct person using two identifiers.  Location: Patient: home Provider: home office   I discussed the limitations of evaluation and management by telemedicine and the availability of in person appointments. The patient expressed understanding and agreed to proceed.     I discussed the assessment and treatment plan with the patient. The patient was provided an opportunity to ask questions and all were answered. The patient agreed with the plan and demonstrated an understanding of the instructions.   The patient was advised to call back or seek an in-person evaluation if the symptoms worsen or if the condition fails to improve as anticipated.  I provided 60 minutes of non-face-to-face time during this encounter including chart review and documentation.    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  Patient endorses that since retirement 1 year ago he has been having depression or down days and not coping with retirement and also loneliness at times with not able to help all people and relationship concerns.  Also has had prostate surgery 5 years ago that  may have contributed to his depression.  He states that 1 night before the suicide attempt there was a argument or altercation with wife at one of her sons get together.  He knew that is going to be challenging next day and he felt hopeless and was impulsive event and overdose on Klonopin, Ambien and some tramadol he got admitted for 5 days and was stabilized.  Discharged on Cymbalta, doxepin for sleep.  He has no access to guns as of now and his wife gives him the medication.  He is still getting Klonopin 0.5 mg at night because it helps him sleep but he does not have access to medication more than his regular dose.  Doxepin did help some sleep if he gets anxious and cannot sleep it makes him more anxious and combination of Klonopin and doxepin has been helpful once he gets a good night sleep he is more better and energetic the next day  In general he feels his depression is not there he does not feel hopeless helpless communication the wife is getting much better.  He is trying to get involved back in golf.  He is also driving his tractor mowing the lawn and taking care of at times his in-laws and helping out his son and keeping himself active.  He feels the temperature is getting [place] and that will help him to do more outdoor activities denies feeling of down or depression or suicidaL  Patient does endorse some worries in general as above but they are not excessive he feels his worries are manageable  but at times if he does not take the Klonopin he cannot sleep at night he is on Cymbalta that helps some of the aches and pain that he suffers from pain condition and follows up with a primary care physician mostly taking over-the-counter medications  Does not endorse psychotic symptoms currently in the past does not endorse manic symptoms currently in the past does not endorse any regular use of alcohol does not endorse using drugs  No past psych admission before this  Aggravating factors; retirement,  prostate surgery.  Relationship at time Modifying factors; plays golf, kids, pastor or church, relationship is getting better, drives his tractor  Duration more so 1 year more intense last month      Past Psychiatric History: denies , except for sleep and anxiety being mangaged by primary care  Previous Psychotropic Medications: Yes   Substance Abuse History in the last 12 months:  No.  Consequences of Substance Abuse: NA  Past Medical History:  Past Medical History:  Diagnosis Date   Arthritis    Colon polyps    High cholesterol    Hypertension    Joint pain    Prostate cancer ([place])    Sleep apnea     Past Surgical History:  Procedure Laterality Date   LASIK Bilateral    PROSTATECTOMY     REPLACEMENT TOTAL KNEE     SHOULDER SURGERY      Family Psychiatric History: denies  Family History:  Family History  Problem Relation Age of Onset   Cancer Father     Social History:   Social History   Socioeconomic History   Marital status: Married    Spouse name: Not on file   Number of children: Not on file   Years of education: Not on file   Highest education level: Not on file  Occupational History   Not on file  Tobacco Use   Smoking status: Former    Types: Cigarettes   Smokeless tobacco: Never  Vaping Use   Vaping Use: Never used  Substance and Sexual Activity   Alcohol use: Yes    Comment: 1 standard drink 2x monthly   Drug use: Never   Sexual activity: Not Currently    Partners: Female  Other Topics Concern   Not on file  Social History Narrative   Not on file   Social Determinants of Health   Financial Resource Strain: Not on file  Food Insecurity: Not on file  Transportation Needs: Not on file  Physical Activity: Not on file  Stress: Not on file  Social Connections: Not on file    Additional Social History: grew up with parents, overall good  Married for 55 years , retired now from truck driving  Allergies:   Allergies  Allergen  Reactions   Mirtazapine     "Made me feel awful"   Trazodone And Nefazodone     Vision blurry/eyes water    Metabolic Disorder Labs: No results found for: "HGBA1C", "MPG" No results found for: "PROLACTIN" Lab Results  Component Value Date   CHOL 131 12/21/2020   TRIG 68 12/21/2020   HDL 41 12/21/2020   CHOLHDL 3.2 12/21/2020   LDLCALC 76 12/21/2020   No results found for: "TSH"  Therapeutic Level Labs: No results found for: "LITHIUM" No results found for: "CBMZ" No results found for: "VALPROATE"  Current Medications: Current Outpatient Medications  Medication Sig Dispense Refill   clonazePAM (KLONOPIN) 0.5 MG tablet Take 1 tablet (0.5 mg total) by  mouth at bedtime as needed (anxiety).     doxepin (SINEQUAN) 25 MG capsule One or two at bedtime. 60 capsule 3   DULoxetine (CYMBALTA) 30 MG capsule Take 1 capsule (30 mg total) by mouth daily after breakfast. 30 capsule 3   lisinopril (ZESTRIL) 40 MG tablet Take 1 tablet (40 mg total) by mouth daily. 30 tablet 3   meloxicam (MOBIC) 15 MG tablet TAKE ONE TABLET (15 MG) BY MOUTH DAILY 90 tablet 0   simvastatin (ZOCOR) 40 MG tablet Take 40 mg by mouth daily.     vitamin B-12 (CYANOCOBALAMIN) 500 MCG tablet Take 500 mcg by mouth daily.     No current facility-administered medications for this visit.     Psychiatric Specialty Exam: Review of Systems  Cardiovascular:  Negative for chest pain.  Neurological:  Negative for tremors.  Psychiatric/Behavioral:  Negative for agitation.     There were no vitals taken for this visit.There is no height or weight on file to calculate BMI.  General Appearance: Casual  Eye Contact:  Fair  Speech:  Normal Rate  Volume:  Normal  Mood:  Euthymic  Affect:  Congruent  Thought Process:  Goal Directed  Orientation:  Full (Time, Place, and Person)  Thought Content:  Rumination  Suicidal Thoughts:  No  Homicidal Thoughts:  No  Memory:  Immediate;   Fair  Judgement:  Fair  Insight:  Fair   Psychomotor Activity:  Normal  Concentration:  Concentration: Fair  Recall:  [place] of Knowledge:Good  Language: Good  Akathisia:  No  Handed:    AIMS (if indicated):  not done  Assets:  Desire for Improvement Financial Resources/Insurance Housing Leisure Time Social Support  ADL's:  Intact  Cognition: WNL  Sleep:  Fair with meds   Screenings: AUDIT    Flowsheet Row Admission (Discharged) from 09/05/2021 in [place]  Alcohol Use Disorder Identification Test Final Score (AUDIT) 0      GAD-7    Flowsheet Row Office Visit from 08/23/2021 in [place] Office Visit from [facility]/23/2021 in [facility] Visit from 12/17/2020 in [place] Visit from 09/16/2020 in [place]  Total GAD-7 Score '4 4 5 4      '$ PHQ2-9    [place] Visit from 09/26/2021 in [place] Office Visit from 08/23/2021 in [place] Visit from 05/23/2021 in [place] Visit from 02/23/2021 in [place] Visit from 12/17/2020 in [place]  PHQ-2 Total Score 1 0 0 2 0  PHQ-9 Total Score -- 4 -- 6 4      [place] Office Visit from 09/26/2021 in [place] Admission (Discharged) from 09/05/2021 in [place] ED to Hosp-Admission (Discharged) from 09/04/2021 in [place] [facility]  C-SSRS RISK CATEGORY Error: Q3, 4, or 5 should not be populated when Q2 is No High Risk High Risk       Assessment and Plan: as follows  Major depressive disorder moderate to severe; improved since hospital admission does not endorse feeling down  depressed on the day-to-day basis he looks forward to the day and is doing activities communication wife is getting better continue  Cymbalta  Generalized anxiety disorder; manageable he is getting involved in more activities continue Cymbalta he is on a small dose of Klonopin understands the risk and he understands that we may need to cut down the dose as of now it helps him sleep otherwise he gets anxious medication to be kept by wife and not to be given more than his regular daily dose  Insomnia; reviewed sleep hygiene as of now doxepin and Klonopin combination is helping down the road we will work on cutting down the Klonopin or consider change to BuSpar  Patient is not endorse hopelessness or suicidal thoughts medication to be kept by wife no guns current access to him  Fu 4 -5 weeks in office next visit  Meds reviewed, questions addressed and he continues to do better in regard to depression.  Collaboration of Care: Other hospital and discharge summary reviewed  Patient/Guardian was advised Release of Information must be obtained prior to any record release in order to collaborate their care with an outside provider. Patient/Guardian was advised if they have not already done so to contact the registration department to sign all necessary forms in order for [place] to release information regarding their care.   Consent: Patient/Guardian gives verbal consent for treatment and assignment of benefits for services provided during this visit. Patient/Guardian expressed understanding and agreed to proceed.   Merian Capron, MD 9/11/202311:37 AM

## 2021-09-27 ENCOUNTER — Encounter: Payer: Self-pay | Admitting: Sports Medicine

## 2021-09-27 ENCOUNTER — Ambulatory Visit (INDEPENDENT_AMBULATORY_CARE_PROVIDER_SITE_OTHER): Payer: Medicare Other | Admitting: Physician Assistant

## 2021-09-27 ENCOUNTER — Encounter: Payer: Self-pay | Admitting: Physician Assistant

## 2021-09-27 VITALS — BP 167/72 | HR 73 | Ht 70.0 in | Wt 217.0 lb

## 2021-09-27 DIAGNOSIS — Z9151 Personal history of suicidal behavior: Secondary | ICD-10-CM | POA: Insufficient documentation

## 2021-09-27 DIAGNOSIS — I10 Essential (primary) hypertension: Secondary | ICD-10-CM | POA: Diagnosis not present

## 2021-09-27 DIAGNOSIS — F331 Major depressive disorder, recurrent, moderate: Secondary | ICD-10-CM

## 2021-09-27 DIAGNOSIS — Z23 Encounter for immunization: Secondary | ICD-10-CM

## 2021-09-27 DIAGNOSIS — Z79899 Other long term (current) drug therapy: Secondary | ICD-10-CM

## 2021-09-27 DIAGNOSIS — G4700 Insomnia, unspecified: Secondary | ICD-10-CM

## 2021-09-27 DIAGNOSIS — F419 Anxiety disorder, unspecified: Secondary | ICD-10-CM

## 2021-09-27 NOTE — Progress Notes (Signed)
Established Patient Office Visit  Subjective   Patient ID: Randall Hanson, male    DOB: Jun 25, 1950  Age: 71 y.o. MRN: [id number]  No chief complaint on file.   HPI Patient is a 71 year old male with MDD and chronic pain and recent suicide attempt who is here for follow-up.  On 09/03/2021 patient had a very bad day that culminated in a fight with his wife.  He felt hopeless and decided to take the tramadol, Ambien, Klonopin in his possession and kill himself.  He was found by his wife the next morning and taken to the hospital.  He spent 5 days in the psychiatric ward at the hospital and has since been released.  He did follow-up with his psychiatrist yesterday.  He was taken off tramadol and Ambien and placed on doxepin.  He was left on Klonopin as needed as well as Cymbalta.  He has started counseling with his pastor at church.  He does feel much better now.  He has had a lot of conversations with his wife.  He denies any suicidal or homicidal thoughts at this time.  He does report more overall chronic pain since being taken off the tramadol.  At this time he does not want any intervention.  He has been monitoring his blood pressure and noticing his blood pressure has been more elevated.  He denies any chest pain, palpitations, headaches or vision changes.  He continues to take his lisinopril 40 mg daily.  .. Active Ambulatory Problems    Diagnosis Date Noted   Benign essential hypertension 08/02/2020   Diverticulosis of large intestine without hemorrhage 06/21/2015   Dyslipidemia 02/15/2014   ED (erectile dysfunction) 12/29/2011   History of total knee arthroplasty, right 06/01/2014   OSA (obstructive sleep apnea) 09/16/2020   Personal history of pulmonary embolism 05/22/2018   Prediabetes 11/17/2019   Prostate cancer ([place]) 05/30/2016   Sensorineural hearing loss (SNHL), bilateral 09/16/2020   Tubular adenoma of colon 06/21/2015   Vitamin B 12 deficiency 10/23/2015   Combined  forms of age-related cataract of left eye 07/24/2019   Insomnia 09/16/2020   Primary osteoarthritis involving multiple joints 09/16/2020   Lumbar spondylosis 09/16/2020   Anxiety 09/16/2020   Aortic atherosclerosis ([place]) 09/21/2020   Vitamin D insufficiency 12/22/2020   Depressed mood 02/23/2021   Chronic pain syndrome 02/23/2021   Left hip pain 02/23/2021   Suicide attempt by multiple drug overdose ([place]) 09/04/2021   AKI (acute kidney injury) ([place]) 09/04/2021   Aspiration pneumonitis ([place]) 09/04/2021   MDD (major depressive disorder), recurrent episode, moderate ([place]) 09/05/2021   Resolved Ambulatory Problems    Diagnosis Date Noted   DDD (degenerative disc disease), lumbar 09/21/2020   Past Medical History:  Diagnosis Date   Arthritis    Colon polyps    High cholesterol    Hypertension    Joint pain    Sleep apnea      ROS See HPI.    Objective:     There were no vitals taken for this visit. BP Readings from Last 3 Encounters:  09/27/21 (!) 167/72  09/09/21 (!) 144/72  09/05/21 (!) 160/71   Wt Readings from Last 3 Encounters:  09/27/21 217 lb (98.4 kg)  09/05/21 219 lb 8 oz (99.6 kg)  09/05/21 211 lb 10.3 oz (96 kg)    ..    09/27/2021    9:23 AM 09/26/2021   11:23 AM 08/23/2021   11:21 AM 05/23/2021   10:45 AM 02/23/2021  7:35 AM  Depression screen PHQ 2/9  Decreased Interest 0 0 0 0 1  Down, Depressed, Hopeless 1 1 0 0 1  PHQ - 2 Score 1 1 0 0 2  Altered sleeping '1  1  2  '$ Tired, decreased energy '1  1  1  '$ Change in appetite 2  1  0  Feeling bad or failure about yourself  '1  1  1  '$ Trouble concentrating 0  0  0  Moving slowly or fidgety/restless 0  0  0  Suicidal thoughts 0  0  0  PHQ-9 Score '6  4  6  '$ Difficult doing work/chores Not difficult at all  Not difficult at all  Somewhat difficult   ..    09/27/2021    9:23 AM 08/23/2021   11:21 AM 02/23/2021    7:36 AM 12/17/2020    9:29 AM  GAD 7 : Generalized Anxiety Score  Nervous, Anxious, on Edge 1 1 0  1  Control/stop worrying 0 '1 1 1  '$ Worry too much - different things 0 '1 2 1  '$ Trouble relaxing 0 '1 1 1  '$ Restless 1 0 0 0  Easily annoyed or irritable 0 0 0 1  Afraid - awful might happen 0 0 0 0  Total GAD 7 Score '2 4 4 5  '$ Anxiety Difficulty Not difficult at all Not difficult at all Somewhat difficult Somewhat difficult      Physical Exam Constitutional:      Appearance: Normal appearance.  Cardiovascular:     Rate and Rhythm: Normal rate and regular rhythm.  Pulmonary:     Effort: Pulmonary effort is normal.  Musculoskeletal:     Right lower leg: No edema.     Left lower leg: No edema.  Neurological:     General: No focal deficit present.     Mental Status: He is alert and oriented to person, place, and time.  Psychiatric:        Mood and Affect: Mood normal.           Assessment & Plan:  Marland KitchenMarland KitchenKhadeem was seen today for hospitalization follow-up.  Diagnoses and all orders for this visit:  Moderate episode of recurrent major depressive disorder ([place])  Needs flu shot -     Flu Vaccine QUAD High Dose(Fluad)  Medication management -     COMPLETE METABOLIC PANEL WITH GFR  Benign essential hypertension  Anxiety  Insomnia, unspecified type  History of suicide attempt   Continue management with BH.  Continue counseling with pastor at church Wife is in possession of medications Guns have been locked  BP elevated today and did not go down with 2nd recheck He will keep home log and follow up in 4 weeks if still elevated will add medication to lisinopril Discussed regular walking/exercise and low salt diet   Iran Planas, PA-C

## 2021-09-28 LAB — COMPLETE METABOLIC PANEL WITH GFR
AG Ratio: 2 (calc) (ref 1.0–2.5)
ALT: 11 U/L (ref 9–46)
AST: 13 U/L (ref 10–35)
Albumin: 4.3 g/dL (ref 3.6–5.1)
Alkaline phosphatase (APISO): 39 U/L (ref 35–144)
BUN: 22 mg/dL (ref 7–25)
CO2: 28 mmol/L (ref 20–32)
Calcium: 9.7 mg/dL (ref 8.6–10.3)
Chloride: 106 mmol/L (ref 98–110)
Creat: 1.14 mg/dL (ref 0.70–1.35)
Globulin: 2.2 g/dL (calc) (ref 1.9–3.7)
Glucose, Bld: 99 mg/dL (ref 65–99)
Potassium: 4.4 mmol/L (ref 3.5–5.3)
Sodium: 141 mmol/L (ref 135–146)
Total Bilirubin: 0.6 mg/dL (ref 0.2–1.2)
Total Protein: 6.5 g/dL (ref 6.1–8.1)
eGFR: 70 mL/min/{1.73_m2} (ref >=60)

## 2021-09-28 NOTE — Progress Notes (Signed)
Labs back to normal.

## 2021-10-04 ENCOUNTER — Ambulatory Visit (INDEPENDENT_AMBULATORY_CARE_PROVIDER_SITE_OTHER): Payer: Medicare Other | Admitting: Sports Medicine

## 2021-10-04 ENCOUNTER — Ambulatory Visit (INDEPENDENT_AMBULATORY_CARE_PROVIDER_SITE_OTHER): Payer: Medicare Other

## 2021-10-04 DIAGNOSIS — M25552 Pain in left hip: Secondary | ICD-10-CM | POA: Diagnosis not present

## 2021-10-04 DIAGNOSIS — M1612 Unilateral primary osteoarthritis, left hip: Secondary | ICD-10-CM | POA: Diagnosis not present

## 2021-10-04 DIAGNOSIS — M47816 Spondylosis without myelopathy or radiculopathy, lumbar region: Secondary | ICD-10-CM | POA: Diagnosis not present

## 2021-10-04 NOTE — Assessment & Plan Note (Signed)
Worsening left hip pain, did not respond to analgesics, pain is in the groin, significant loss of internal rotation. Injected his left hip today, adding x-rays, home conditioning, return in 6 weeks.

## 2021-10-04 NOTE — Progress Notes (Signed)
    Procedures performed today:    Procedure: Real-time Ultrasound Guided injection of the left hip joint Device: Samsung HS60  Verbal informed consent obtained.  Time-out conducted.  Noted no overlying erythema, induration, or other signs of local infection.  Skin prepped in a sterile fashion.  Local anesthesia: Topical Ethyl chloride.  With sterile technique and under real time ultrasound guidance: Noted arthritic changes, 1 cc Kenalog 40, 2 cc lidocaine, 2 cc bupivacaine injected easily Completed without difficulty  Advised to call if fevers/chills, erythema, induration, drainage, or persistent bleeding.  Images permanently stored and available for review in PACS.  Impression: Technically successful ultrasound guided injection.  Independent interpretation of notes and tests performed by another provider:   None.  Brief History, Exam, Impression, and Recommendations:    Primary osteoarthritis of left hip Worsening left hip pain, did not respond to analgesics, pain is in the groin, significant loss of internal rotation. Injected his left hip today, adding x-rays, home conditioning, return in 6 weeks.  Lumbar spondylosis [place] returns, he has lumbar spondylosis, we traced his pain to his bilateral L4-S1 facet joints, he did really well after a couple of sets of facet joint injections, unfortunately efficacy is waning so I think we should proceed with medial branch blocks and RFA, referral to Dr. Francesco Runner. He is also complaining of worsening weakness in the legs, he did have some mild lumbar spinal stenosis on MRI from last year but not enough to really cause the weakness, I will go ahead and order nerve conduction and EMG.    ____________________________________________ Gwen Her. Dianah Field, M.D., ABFM., CAQSM., AME. Primary Care and Sports Medicine [facility]  Adjunct Professor of [place] [employer]  Stage manager

## 2021-10-04 NOTE — Assessment & Plan Note (Signed)
Randall Hanson returns, he has lumbar spondylosis, we traced his pain to his bilateral L4-S1 facet joints, he did really well after a couple of sets of facet joint injections, unfortunately efficacy is waning so I think we should proceed with medial branch blocks and RFA, referral to Dr. Francesco Runner. He is also complaining of worsening weakness in the legs, he did have some mild lumbar spinal stenosis on MRI from last year but not enough to really cause the weakness, I will go ahead and order nerve conduction and EMG.

## 2021-10-11 ENCOUNTER — Encounter: Payer: Self-pay | Admitting: Physician Assistant

## 2021-10-11 ENCOUNTER — Ambulatory Visit (INDEPENDENT_AMBULATORY_CARE_PROVIDER_SITE_OTHER): Payer: Medicare Other | Admitting: Physician Assistant

## 2021-10-11 VITALS — BP 155/65 | HR 73 | Temp 98.3°F | Ht 70.0 in | Wt 212.0 lb

## 2021-10-11 DIAGNOSIS — I10 Essential (primary) hypertension: Secondary | ICD-10-CM | POA: Insufficient documentation

## 2021-10-11 MED ORDER — HYDROCHLOROTHIAZIDE 12.5 MG PO TABS: 12.5000 mg | ORAL_TABLET | Freq: Every day | ORAL | 1 refills | Status: DC

## 2021-10-11 NOTE — Progress Notes (Signed)
Established Patient Office Visit  Subjective   Patient ID: Randall Hanson, male    DOB: October 06, 1950  Age: 71 y.o. MRN: [id number]  Chief Complaint  Patient presents with   Hypertension    HPI Pt is a 71 yo male with HTN who presents to the clinic for follow up. Last visit BP was pretty elevated. Pt has monitored at home for the last 2 weeks and ranging 140's to 150's over 70-80s. No CP, palpitations, headaches or dizziness. He is taking lisinopril daily.   His mood is good. His overall pain is good as well. He has been more active and enjoying life.   .. Active Ambulatory Problems    Diagnosis Date Noted   Benign essential hypertension 08/02/2020   Diverticulosis of large intestine without hemorrhage 06/21/2015   Dyslipidemia 02/15/2014   ED (erectile dysfunction) 12/29/2011   History of total knee arthroplasty, right 06/01/2014   OSA (obstructive sleep apnea) 09/16/2020   Personal history of pulmonary embolism 05/22/2018   Prediabetes 11/17/2019   Prostate cancer ([place]) 05/30/2016   Sensorineural hearing loss (SNHL), bilateral 09/16/2020   Tubular adenoma of colon 06/21/2015   Vitamin B 12 deficiency 10/23/2015   Combined forms of age-related cataract of left eye 07/24/2019   Insomnia 09/16/2020   Primary osteoarthritis of left hip 09/16/2020   Lumbar spondylosis 09/16/2020   Anxiety 09/16/2020   Aortic atherosclerosis ([place]) 09/21/2020   Vitamin D insufficiency 12/22/2020   Depressed mood 02/23/2021   Chronic pain syndrome 02/23/2021   Left hip pain 02/23/2021   Suicide attempt by multiple drug overdose ([place]) 09/04/2021   AKI (acute kidney injury) ([place]) 09/04/2021   Aspiration pneumonitis ([place]) 09/04/2021   MDD (major depressive disorder), recurrent episode, moderate ([place]) 09/05/2021   History of suicide attempt 09/27/2021   Hypertension goal BP (blood pressure) < 130/80 10/11/2021   Resolved Ambulatory Problems    Diagnosis Date Noted   DDD (degenerative disc  disease), lumbar 09/21/2020   Past Medical History:  Diagnosis Date   Arthritis    Colon polyps    High cholesterol    Hypertension    Joint pain    Sleep apnea      Review of Systems  All other systems reviewed and are negative.     Objective:     BP (!) 155/65   Pulse 73   Temp 98.3 F (36.8 C) (Oral)   Ht '5\' 10"'$  (1.778 m)   Wt 212 lb (96.2 kg)   SpO2 100%   BMI 30.42 kg/m  BP Readings from Last 3 Encounters:  10/11/21 (!) 155/65  09/27/21 (!) 167/72  09/05/21 (!) 160/71   Wt Readings from Last 3 Encounters:  10/11/21 212 lb (96.2 kg)  09/27/21 217 lb (98.4 kg)  09/05/21 211 lb 10.3 oz (96 kg)      Physical Exam Constitutional:      Appearance: Normal appearance.  HENT:     Head: Normocephalic.  Neck:     Vascular: No carotid bruit.  Cardiovascular:     Rate and Rhythm: Normal rate and regular rhythm.     Pulses: Normal pulses.     Heart sounds: Normal heart sounds.  Pulmonary:     Effort: Pulmonary effort is normal.     Breath sounds: Normal breath sounds.  Musculoskeletal:     Right lower leg: No edema.     Left lower leg: No edema.  Neurological:     General: No focal deficit present.  Mental Status: He is alert and oriented to person, place, and time.  Psychiatric:        Mood and Affect: Mood normal.       ..The 10-year ASCVD risk score (Arnett DK, et al., 2019) is: 23.3%   Values used to calculate the score:     Age: 78 years     Sex: Male     Is Non-Hispanic African American: No     Diabetic: No     Tobacco smoker: No     Systolic Blood Pressure: 681 mmHg     Is BP treated: Yes     HDL Cholesterol: 41 mg/dL     Total Cholesterol: 131 mg/dL    Assessment & Plan:  Marland KitchenMarland KitchenDametrius was seen today for hypertension.  Diagnoses and all orders for this visit:  Benign essential hypertension -     hydrochlorothiazide (HYDRODIURIL) 12.5 MG tablet; Take 1 tablet (12.5 mg total) by mouth daily.  Hypertension goal BP (blood pressure) <  130/80 -     hydrochlorothiazide (HYDRODIURIL) 12.5 MG tablet; Take 1 tablet (12.5 mg total) by mouth daily.   Reviewed BP log Continue to keep BP Continue on lisinopril '40mg'$  daily and added HCTZ in the morning Overall 10 year CV risk is elevated  On statin goal BP is under 130/80 Nurse visit in 4 weeks  Return in about 4 weeks (around 11/08/2021) for nurse visit.    Iran Planas, PA-C

## 2021-10-11 NOTE — Progress Notes (Signed)
Home Blood Pressure Readings 09/27/2021 143/80 09/28/2021 139/68   156/65   144/75 09/29/2021 151/71   139/72 10/02/2021 167/62   155/70 10/03/2021 162/66   156/72 10/04/2021 174/64   152/62 10/05/2021 142/68   139/64 10/07/2021 157/72   155/74 10/08/2021 163/66   166/73   158/82   167/80 10/09/2021 162/72   153/74 10/10/2021 154/73   145/69

## 2021-10-11 NOTE — Patient Instructions (Signed)
Start HCTZ in the morning for blood pressure.

## 2021-10-14 ENCOUNTER — Encounter (INDEPENDENT_AMBULATORY_CARE_PROVIDER_SITE_OTHER): Payer: Medicare Other | Admitting: Sports Medicine

## 2021-10-14 DIAGNOSIS — M47816 Spondylosis without myelopathy or radiculopathy, lumbar region: Secondary | ICD-10-CM

## 2021-10-14 NOTE — Telephone Encounter (Signed)
I spent 5 total minutes of online digital evaluation and management services in this patient-initiated request for online care. 

## 2021-11-01 ENCOUNTER — Ambulatory Visit (HOSPITAL_COMMUNITY): Payer: Medicare Other | Admitting: Psychiatry

## 2021-11-11 ENCOUNTER — Ambulatory Visit (INDEPENDENT_AMBULATORY_CARE_PROVIDER_SITE_OTHER): Payer: Medicare Other | Admitting: Physician Assistant

## 2021-11-11 VITALS — BP 143/64 | HR 75 | Ht 70.0 in

## 2021-11-11 DIAGNOSIS — I10 Essential (primary) hypertension: Secondary | ICD-10-CM

## 2021-11-11 NOTE — Progress Notes (Signed)
   Established Patient Office Visit  Subjective   Patient ID: Randall Hanson, male    DOB: [birth year]  Age: 71 y.o. MRN: [id number]  Chief Complaint  Patient presents with   Hypertension    Nurse visit BP check     HPI  Hypertension-nurse visit BP check-pt denies chest pain , shortness of breath or medication problems. Patient home readings: 141/62,138/65,151/65,133/66,138/63,119/62,127/70,138/60,113/60,131/67,125/63,152/63,134/70,135/63,129/61,134/61,138/68,134/65,138/68.  ROS    Objective:     BP (!) 143/64   Pulse 75   Ht '5\' 10"'$  (1.778 m)   SpO2 98%   BMI 30.42 kg/m    Physical Exam   No results found for any visits on 11/11/21.    The 10-year ASCVD risk score (Arnett DK, et al., 2019) is: 20.5%    Assessment & Plan:  Hypertension- BP check -  initial reading 138/76 second reading a little higher 143/64 (pt states " I have been sitting here thinking about things" ) Iran Planas, PA reviewed results and recommended patient stay on Continue current medication regimen and f/u are current scheduled appt on 02/24/2022. Problem List Items Addressed This Visit       Cardiovascular and Mediastinum   Benign essential hypertension - Primary    Return in about 15 weeks (around 02/24/2022) for Hypertension f/u  with provider.    Rae Lips, LPN

## 2021-11-11 NOTE — Progress Notes (Signed)
Goal under 130/80 pt has home readings that are under this. For now will leave the same.  Recheck in 4 months.

## 2021-11-11 NOTE — Patient Instructions (Signed)
Continue current medication regimen and f/u are current scheduled appt on 02/24/2022.

## 2021-11-15 ENCOUNTER — Encounter: Payer: Self-pay | Admitting: Sports Medicine

## 2021-11-15 ENCOUNTER — Ambulatory Visit (INDEPENDENT_AMBULATORY_CARE_PROVIDER_SITE_OTHER): Payer: Medicare Other | Admitting: Sports Medicine

## 2021-11-15 DIAGNOSIS — M4316 Spondylolisthesis, lumbar region: Secondary | ICD-10-CM

## 2021-11-15 DIAGNOSIS — M1612 Unilateral primary osteoarthritis, left hip: Secondary | ICD-10-CM

## 2021-11-15 DIAGNOSIS — M47816 Spondylosis without myelopathy or radiculopathy, lumbar region: Secondary | ICD-10-CM

## 2021-11-15 MED ORDER — TRAMADOL HCL 50 MG PO TABS: 50.0000 mg | ORAL_TABLET | Freq: Two times a day (BID) | ORAL | 0 refills | Status: DC | PRN

## 2021-11-15 NOTE — Assessment & Plan Note (Signed)
There is chronic axial low back pain, pain traced to the bilateral L4-S1 facet joints, he did well with some facet joint injections, unfortunately with waning efficacy. I did refer him to Dr. Francesco Runner for consideration of medial branch blocks and RFA, at this point he feels pretty good and is happy to hold off on any additional injections. If we do proceed with additional facet joint injections he says he would prefer it to be done at [place]. We can add some tramadol to use as needed. Return to see me as needed.

## 2021-11-15 NOTE — Assessment & Plan Note (Signed)
Doing extremely well after left hip joint injection at the last visit, return to see me as needed for this.

## 2021-11-15 NOTE — Progress Notes (Signed)
    Procedures performed today:    None.  Independent interpretation of notes and tests performed by another provider:   None.  Brief History, Exam, Impression, and Recommendations:    Primary osteoarthritis of left hip Doing extremely well after left hip joint injection at the last visit, return to see me as needed for this.  Lumbar spondylosis There is chronic axial low back pain, pain traced to the bilateral L4-S1 facet joints, he did well with some facet joint injections, unfortunately with waning efficacy. I did refer him to Dr. Francesco Runner for consideration of medial branch blocks and RFA, at this point he feels pretty good and is happy to hold off on any additional injections. If we do proceed with additional facet joint injections he says he would prefer it to be done at [place]. We can add some tramadol to use as needed. Return to see me as needed.    ____________________________________________ Gwen Her. Dianah Field, M.D., ABFM., CAQSM., AME. Primary Care and Sports Medicine [facility]  Adjunct Professor of [employer] [facility] of Medicine  Risk manager

## 2021-11-20 ENCOUNTER — Other Ambulatory Visit: Payer: Self-pay | Admitting: Physician Assistant

## 2021-11-20 DIAGNOSIS — F419 Anxiety disorder, unspecified: Secondary | ICD-10-CM

## 2021-11-20 DIAGNOSIS — I10 Essential (primary) hypertension: Secondary | ICD-10-CM

## 2021-11-21 NOTE — Telephone Encounter (Signed)
Reviewed ED notes. Left pt on klonapin prn as needed for anxiety. Refilled today.

## 2021-11-21 NOTE — Telephone Encounter (Signed)
Last seen in September, to follow up in February Last written 09/05/2021 by an outside provider, please advise.

## 2021-12-04 ENCOUNTER — Encounter: Payer: Self-pay | Admitting: Physician Assistant

## 2021-12-05 MED ORDER — SIMVASTATIN 40 MG PO TABS
40.0000 mg | ORAL_TABLET | Freq: Every day | ORAL | 3 refills | Status: DC
Start: 2021-12-05 — End: 2022-08-28

## 2021-12-13 ENCOUNTER — Other Ambulatory Visit: Payer: Self-pay | Admitting: Physician Assistant

## 2021-12-13 DIAGNOSIS — M5442 Lumbago with sciatica, left side: Secondary | ICD-10-CM

## 2021-12-13 DIAGNOSIS — M159 Polyosteoarthritis, unspecified: Secondary | ICD-10-CM

## 2021-12-21 ENCOUNTER — Encounter: Payer: Self-pay | Admitting: Sports Medicine

## 2021-12-27 ENCOUNTER — Ambulatory Visit (INDEPENDENT_AMBULATORY_CARE_PROVIDER_SITE_OTHER): Payer: Medicare Other

## 2021-12-27 ENCOUNTER — Ambulatory Visit (INDEPENDENT_AMBULATORY_CARE_PROVIDER_SITE_OTHER): Payer: Medicare Other | Admitting: Sports Medicine

## 2021-12-27 DIAGNOSIS — M1612 Unilateral primary osteoarthritis, left hip: Secondary | ICD-10-CM | POA: Diagnosis not present

## 2021-12-27 MED ORDER — DICLOFENAC SODIUM 75 MG PO TBEC: 75.0000 mg | DELAYED_RELEASE_TABLET | Freq: Two times a day (BID) | ORAL | 3 refills | Status: DC

## 2021-12-27 NOTE — Assessment & Plan Note (Addendum)
This is a very pleasant 71 year old male, known hip osteoarthritis, last injected in September of this year, recurrence of pain, repeat injection today. He did not get a full 3 months of relief after last injection so he does understand arthroplasty is on the horizon. Switching from meloxicam to Voltaren. Low back pain continues to do well after facet joint injections back in May.

## 2021-12-27 NOTE — Progress Notes (Signed)
    Procedures performed today:    Procedure: Real-time Ultrasound Guided injection of the left hip joint Device: Samsung HS60  Verbal informed consent obtained.  Time-out conducted.  Noted no overlying erythema, induration, or other signs of local infection.  Skin prepped in a sterile fashion.  Local anesthesia: Topical Ethyl chloride.  With sterile technique and under real time ultrasound guidance: Arthritic hip joint noted, 1 cc Kenalog 40, 2 cc lidocaine, 2 cc bupivacaine injected easily Completed without difficulty  Advised to call if fevers/chills, erythema, induration, drainage, or persistent bleeding.  Images permanently stored and available for review in PACS.  Impression: Technically successful ultrasound guided injection.  Independent interpretation of notes and tests performed by another provider:   None.  Brief History, Exam, Impression, and Recommendations:    Primary osteoarthritis of left hip This is a very pleasant 71 year old male, known hip osteoarthritis, last injected in September of this year, recurrence of pain, repeat injection today. He did not get a full 3 months of relief after last injection so he does understand arthroplasty is on the horizon. Switching from meloxicam to Voltaren. Low back pain continues to do well after facet joint injections back in May.    ____________________________________________ Gwen Her. Dianah Field, M.D., ABFM., CAQSM., AME. Primary Care and Sports Medicine [facility]  Adjunct Professor of [employer] of [facility] of Medicine  Risk manager

## 2022-01-24 ENCOUNTER — Other Ambulatory Visit: Payer: Self-pay | Admitting: Sports Medicine

## 2022-01-24 DIAGNOSIS — M4316 Spondylolisthesis, lumbar region: Secondary | ICD-10-CM

## 2022-02-17 ENCOUNTER — Other Ambulatory Visit: Payer: Self-pay | Admitting: Physician Assistant

## 2022-02-24 ENCOUNTER — Ambulatory Visit (INDEPENDENT_AMBULATORY_CARE_PROVIDER_SITE_OTHER): Payer: Medicare Other | Admitting: Physician Assistant

## 2022-02-24 ENCOUNTER — Encounter: Payer: Self-pay | Admitting: Physician Assistant

## 2022-02-24 VITALS — BP 142/84 | HR 83 | Ht 70.0 in | Wt 225.0 lb

## 2022-02-24 DIAGNOSIS — I10 Essential (primary) hypertension: Secondary | ICD-10-CM

## 2022-02-24 DIAGNOSIS — R635 Abnormal weight gain: Secondary | ICD-10-CM | POA: Diagnosis not present

## 2022-02-24 DIAGNOSIS — F411 Generalized anxiety disorder: Secondary | ICD-10-CM | POA: Diagnosis not present

## 2022-02-24 DIAGNOSIS — Z125 Encounter for screening for malignant neoplasm of prostate: Secondary | ICD-10-CM

## 2022-02-24 DIAGNOSIS — J0100 Acute maxillary sinusitis, unspecified: Secondary | ICD-10-CM | POA: Diagnosis not present

## 2022-02-24 DIAGNOSIS — E785 Hyperlipidemia, unspecified: Secondary | ICD-10-CM

## 2022-02-24 MED ORDER — LISINOPRIL 40 MG PO TABS: 40.0000 mg | ORAL_TABLET | Freq: Every day | ORAL | 1 refills | Status: DC

## 2022-02-24 MED ORDER — AMOXICILLIN-POT CLAVULANATE 875-125 MG PO TABS: 1.0000 | ORAL_TABLET | Freq: Two times a day (BID) | ORAL | 0 refills | Status: DC

## 2022-02-24 MED ORDER — METFORMIN HCL 500 MG PO TABS: 500.0000 mg | ORAL_TABLET | Freq: Two times a day (BID) | ORAL | 1 refills | Status: DC

## 2022-02-24 MED ORDER — CLONAZEPAM 0.5 MG PO TABS: 0.5000 mg | ORAL_TABLET | Freq: Two times a day (BID) | ORAL | 5 refills | Status: DC | PRN

## 2022-02-24 MED ORDER — HYDROCHLOROTHIAZIDE 12.5 MG PO TABS: 12.5000 mg | ORAL_TABLET | Freq: Every day | ORAL | 1 refills | Status: DC

## 2022-02-24 NOTE — Progress Notes (Signed)
Established Patient Office Visit  Subjective   Patient ID: Randall Hanson, male    DOB: 06/28/1950  Age: 72 y.o. MRN: [id number]  Chief Complaint  Patient presents with   Hypertension    HPI Pt is a 72 yo male with HtN, GAD, HLD, OSA who presents to the clinic for BP follow up. He is checking BP at home and ranging 120-130 over 70-80 most of the time. He denies any CP or palpitations. He has some SOB only when he really pushes himself physically. He is concerned about weight gain since starting doxepin. He is trying to work on portion sizes. He does admit to eating pop tarts for breakfast this morning.   He has had head congestion and sinus pressure for past week. Cough is dry. Taking OTC medications for cold and cough. No fever, chills, body aches. He is blowing out purulent discharge from nose.   .. Active Ambulatory Problems    Diagnosis Date Noted   Benign essential hypertension 08/02/2020   Diverticulosis of large intestine without hemorrhage 06/21/2015   Dyslipidemia 02/15/2014   ED (erectile dysfunction) 12/29/2011   History of total knee arthroplasty, right 06/01/2014   OSA (obstructive sleep apnea) 09/16/2020   Personal history of pulmonary embolism 05/22/2018   Prediabetes 11/17/2019   Prostate cancer ([place]) 05/30/2016   Sensorineural hearing loss (SNHL), bilateral 09/16/2020   Tubular adenoma of colon 06/21/2015   Vitamin B 12 deficiency 10/23/2015   Combined forms of age-related cataract of left eye 07/24/2019   Insomnia 09/16/2020   Primary osteoarthritis of left hip 09/16/2020   Lumbar spondylosis 09/16/2020   Anxiety disorder, unspecified 09/16/2020   Aortic atherosclerosis ([place]) 09/21/2020   Vitamin D insufficiency 12/22/2020   Depressed mood 02/23/2021   Chronic pain syndrome 02/23/2021   Suicide attempt by multiple drug overdose ([place]) 09/04/2021   AKI (acute kidney injury) ([place]) 09/04/2021   Aspiration pneumonitis ([place]) 09/04/2021   MDD (major  depressive disorder), recurrent episode, moderate ([place]) 09/05/2021   History of suicide attempt 09/27/2021   Hypertension goal BP (blood pressure) < 130/80 10/11/2021   Weight gain 02/24/2022   Resolved Ambulatory Problems    Diagnosis Date Noted   DDD (degenerative disc disease), lumbar 09/21/2020   Left hip pain 02/23/2021   Past Medical History:  Diagnosis Date   Arthritis    Colon polyps    High cholesterol    Hypertension    Joint pain    Sleep apnea      ROS See HPI.    Objective:     BP (!) 142/84   Pulse 83   Ht 5' 10"$  (1.778 m)   Wt 225 lb (102.1 kg)   SpO2 100%   BMI 32.28 kg/m  BP Readings from Last 3 Encounters:  02/24/22 (!) 142/84  11/11/21 (!) 143/64  10/11/21 (!) 155/65   Wt Readings from Last 3 Encounters:  02/24/22 225 lb (102.1 kg)  11/15/21 218 lb (98.9 kg)  10/11/21 212 lb (96.2 kg)      Physical Exam Constitutional:      Appearance: Normal appearance. He is obese.  HENT:     Head: Normocephalic.     Comments: Tenderness to palpation over maxillary sinuses    Right Ear: Tympanic membrane, ear canal and external ear normal. There is no impacted cerumen.     Left Ear: Tympanic membrane, ear canal and external ear normal. There is no impacted cerumen.     Ears:     Comments:  Hearing aids bilaterally.     Nose: Nose normal. No congestion or rhinorrhea.     Mouth/Throat:     Mouth: Mucous membranes are moist.     Pharynx: Posterior oropharyngeal erythema present.     Comments: PND present Eyes:     Conjunctiva/sclera: Conjunctivae normal.  Cardiovascular:     Rate and Rhythm: Normal rate and regular rhythm.     Pulses: Normal pulses.  Pulmonary:     Effort: Pulmonary effort is normal.     Breath sounds: Normal breath sounds.  Musculoskeletal:     Cervical back: Normal range of motion. No tenderness.     Right lower leg: No edema.     Left lower leg: No edema.  Lymphadenopathy:     Cervical: No cervical adenopathy.   Neurological:     General: No focal deficit present.     Mental Status: He is alert and oriented to person, place, and time.  Psychiatric:        Mood and Affect: Mood normal.        The 10-year ASCVD risk score (Arnett DK, et al., 2019) is: 21.9%    Assessment & Plan:  Marland KitchenMarland KitchenNahum was seen today for hypertension.  Diagnoses and all orders for this visit:  Benign essential hypertension -     hydrochlorothiazide (HYDRODIURIL) 12.5 MG tablet; Take 1 tablet (12.5 mg total) by mouth daily. -     COMPLETE METABOLIC PANEL WITH GFR  Hypertension goal BP (blood pressure) < 130/80 -     hydrochlorothiazide (HYDRODIURIL) 12.5 MG tablet; Take 1 tablet (12.5 mg total) by mouth daily. -     lisinopril (ZESTRIL) 40 MG tablet; Take 1 tablet (40 mg total) by mouth daily. -     COMPLETE METABOLIC PANEL WITH GFR  GAD (generalized anxiety disorder) -     COMPLETE METABOLIC PANEL WITH GFR -     CBC w/Diff/Platelet -     clonazePAM (KLONOPIN) 0.5 MG tablet; Take 1 tablet (0.5 mg total) by mouth 2 (two) times daily as needed. for anxiety/sleep.  Weight gain -     CBC w/Diff/Platelet -     metFORMIN (GLUCOPHAGE) 500 MG tablet; Take 1 tablet (500 mg total) by mouth 2 (two) times daily with a meal.  Acute non-recurrent maxillary sinusitis -     amoxicillin-clavulanate (AUGMENTIN) 875-125 MG tablet; Take 1 tablet by mouth 2 (two) times daily.  Hyperlipidemia, unspecified hyperlipidemia type -     Lipid Panel w/reflex Direct LDL  Prostate cancer screening -     PSA, total and free -     CBC w/Diff/Platelet   Fasting labs ordered BP home readings look great Sent refills Recheck in 6 months  1 week of symptoms sent augmentin to the pharmacy  Follow up as needed  .Marland KitchenDiscussed low carb diet with 1500 calories and 80g of protein.  Exercising at least 150 minutes a week.  My Fitness Pal could be a Microbiologist.  Start metformin, discussed side effects Follow up in 6 months   Return in  about 6 months (around 08/25/2022).    Randall Planas, PA-C

## 2022-03-01 ENCOUNTER — Other Ambulatory Visit: Payer: Self-pay | Admitting: Neurology

## 2022-03-01 DIAGNOSIS — N289 Disorder of kidney and ureter, unspecified: Secondary | ICD-10-CM

## 2022-03-01 LAB — CBC WITH DIFFERENTIAL/PLATELET
Absolute Monocytes: 516 cells/uL (ref 200–950)
Basophils Absolute: 52 cells/uL (ref 0–200)
Basophils Relative: 0.9 %
Eosinophils Absolute: 261 cells/uL (ref 15–500)
Eosinophils Relative: 4.5 %
HCT: 36.8 % — ABNORMAL LOW (ref 38.5–50.0)
Hemoglobin: 12.5 g/dL — ABNORMAL LOW (ref 13.2–17.1)
Lymphs Abs: 1746 cells/uL (ref 850–3900)
MCH: 32.3 pg (ref 27.0–33.0)
MCHC: 34 g/dL (ref 32.0–36.0)
MCV: 95.1 fL (ref 80.0–100.0)
MPV: 10.5 fL (ref 7.5–12.5)
Monocytes Relative: 8.9 %
Neutro Abs: 3225 cells/uL (ref 1500–7800)
Neutrophils Relative %: 55.6 %
Platelets: 276 10*3/uL (ref 140–400)
RBC: 3.87 10*6/uL — ABNORMAL LOW (ref 4.20–5.80)
RDW: 12.4 % (ref 11.0–15.0)
Total Lymphocyte: 30.1 %
WBC: 5.8 10*3/uL (ref 3.8–10.8)

## 2022-03-01 LAB — COMPLETE METABOLIC PANEL WITH GFR
AG Ratio: 1.6 (calc) (ref 1.0–2.5)
ALT: 10 U/L (ref 9–46)
AST: 18 U/L (ref 10–35)
Albumin: 4.1 g/dL (ref 3.6–5.1)
Alkaline phosphatase (APISO): 44 U/L (ref 35–144)
BUN/Creatinine Ratio: 18 (calc) (ref 6–22)
BUN: 32 mg/dL — ABNORMAL HIGH (ref 7–25)
CO2: 25 mmol/L (ref 20–32)
Calcium: 9.5 mg/dL (ref 8.6–10.3)
Chloride: 108 mmol/L (ref 98–110)
Creat: 1.8 mg/dL — ABNORMAL HIGH (ref 0.70–1.28)
Globulin: 2.6 g/dL (calc) (ref 1.9–3.7)
Glucose, Bld: 88 mg/dL (ref 65–99)
Potassium: 4.5 mmol/L (ref 3.5–5.3)
Sodium: 143 mmol/L (ref 135–146)
Total Bilirubin: 0.5 mg/dL (ref 0.2–1.2)
Total Protein: 6.7 g/dL (ref 6.1–8.1)
eGFR: 40 mL/min/{1.73_m2} — ABNORMAL LOW (ref >=60)

## 2022-03-01 LAB — LIPID PANEL W/REFLEX DIRECT LDL
Cholesterol: 147 mg/dL (ref <200)
HDL: 29 mg/dL — ABNORMAL LOW (ref >=40)
LDL Cholesterol (Calc): 93 mg/dL (calc)
Non-HDL Cholesterol (Calc): 118 mg/dL (calc) (ref <130)
Total CHOL/HDL Ratio: 5.1 (calc) — ABNORMAL HIGH (ref <5.0)
Triglycerides: 150 mg/dL — ABNORMAL HIGH (ref <150)

## 2022-03-01 LAB — PSA, TOTAL AND FREE
PSA, % Free: UNDETERMINED % (calc) (ref >25)
PSA, Free: <0.1 ng/mL
PSA, Total: <0.1 ng/mL (ref <=4.0)

## 2022-03-01 NOTE — Progress Notes (Signed)
Your kidney function has made a big decrease in the last year. Stop voltaren and any other NSAIds. Ok to continue tylenol. Recheck in 3 weeks. Make sure staying hydrated.

## 2022-03-01 NOTE — Progress Notes (Signed)
PSA is great and very low.

## 2022-03-08 ENCOUNTER — Encounter (INDEPENDENT_AMBULATORY_CARE_PROVIDER_SITE_OTHER): Payer: Medicare Other | Admitting: Sports Medicine

## 2022-03-08 DIAGNOSIS — M1612 Unilateral primary osteoarthritis, left hip: Secondary | ICD-10-CM | POA: Diagnosis not present

## 2022-03-09 MED ORDER — HYDROCODONE-ACETAMINOPHEN 5-325 MG PO TABS: 1.0000 | ORAL_TABLET | Freq: Three times a day (TID) | ORAL | 0 refills | Status: DC | PRN

## 2022-03-09 NOTE — Telephone Encounter (Signed)
I spent 5 total minutes of online digital evaluation and management services in this patient-initiated request for online care. 

## 2022-03-13 ENCOUNTER — Telehealth: Payer: Self-pay | Admitting: Physician Assistant

## 2022-03-13 NOTE — Telephone Encounter (Signed)
[place] to schedule their annual wellness visit. Patient declined to schedule AWV at this time.  Lin Givens Patient Arts administrator II Direct Dial: [phone]

## 2022-03-17 ENCOUNTER — Other Ambulatory Visit: Payer: Self-pay | Admitting: Sports Medicine

## 2022-03-17 DIAGNOSIS — M1612 Unilateral primary osteoarthritis, left hip: Secondary | ICD-10-CM

## 2022-03-23 ENCOUNTER — Other Ambulatory Visit: Payer: Self-pay | Admitting: Neurology

## 2022-03-23 DIAGNOSIS — N289 Disorder of kidney and ureter, unspecified: Secondary | ICD-10-CM

## 2022-03-23 LAB — RENAL FUNCTION PANEL
Albumin: 4.1 g/dL (ref 3.6–5.1)
BUN/Creatinine Ratio: 16 (calc) (ref 6–22)
BUN: 25 mg/dL (ref 7–25)
CO2: 29 mmol/L (ref 20–32)
Calcium: 9.5 mg/dL (ref 8.6–10.3)
Chloride: 103 mmol/L (ref 98–110)
Creat: 1.58 mg/dL — ABNORMAL HIGH (ref 0.70–1.28)
Glucose, Bld: 96 mg/dL (ref 65–99)
Phosphorus: 3.5 mg/dL (ref 2.1–4.3)
Potassium: 4.3 mmol/L (ref 3.5–5.3)
Sodium: 140 mmol/L (ref 135–146)

## 2022-03-23 NOTE — Progress Notes (Signed)
GFR improving. Creatinine trending down. BUN came down a little. Kidneys still look dry and litke you are not drinking enough water. Repeat in 6 weeks lab only.

## 2022-03-28 ENCOUNTER — Ambulatory Visit (INDEPENDENT_AMBULATORY_CARE_PROVIDER_SITE_OTHER): Payer: Medicare Other | Admitting: Sports Medicine

## 2022-03-28 ENCOUNTER — Ambulatory Visit (INDEPENDENT_AMBULATORY_CARE_PROVIDER_SITE_OTHER): Payer: Medicare Other

## 2022-03-28 DIAGNOSIS — M1612 Unilateral primary osteoarthritis, left hip: Secondary | ICD-10-CM

## 2022-03-28 MED ORDER — HYDROCODONE-ACETAMINOPHEN 5-325 MG PO TABS: 1.0000 | ORAL_TABLET | Freq: Three times a day (TID) | ORAL | 0 refills | Status: DC | PRN

## 2022-03-28 NOTE — Assessment & Plan Note (Addendum)
Pleasant 72 year old male, known hip osteoarthritis last injected December 2023, recurrence of pain, repeat hip joint injection. Refilling hydrocodone, the injection and hydrocodone should get him through tax season. He understands arthroplasty is next and the injection will delay arthroplasty for 6 to 12 weeks. Referral to Dr. Berenice Primas.

## 2022-03-28 NOTE — Progress Notes (Signed)
    Procedures performed today:    Procedure: Real-time Ultrasound Guided injection of the left hip joint Device: Samsung HS60  Verbal informed consent obtained.  Time-out conducted.  Noted no overlying erythema, induration, or other signs of local infection.  Skin prepped in a sterile fashion.  Local anesthesia: Topical Ethyl chloride.  With sterile technique and under real time ultrasound guidance: Arthritic joint noted, 22 spinal needle advanced to the femoral head/neck junction, 1 cc Kenalog 40, 2 cc lidocaine, 2 cc bupivacaine injected easily Completed without difficulty  Advised to call if fevers/chills, erythema, induration, drainage, or persistent bleeding.  Images permanently stored and available for review in PACS.  Impression: Technically successful ultrasound guided injection.  Independent interpretation of notes and tests performed by another provider:   None.  Brief History, Exam, Impression, and Recommendations:    Primary osteoarthritis of left hip Pleasant 72 year old male, known hip osteoarthritis last injected December 2023, recurrence of pain, repeat hip joint injection. Refilling hydrocodone, the injection and hydrocodone should get him through tax season. He understands arthroplasty is next and the injection will delay arthroplasty for 6 to 12 weeks. Referral to Dr. Berenice Primas.    ____________________________________________ Gwen Her. Dianah Field, M.D., ABFM., CAQSM., AME. Primary Care and Sports Medicine [facility]  Adjunct Professor of [place] [place] [employer] [facility] of Medicine  Risk manager

## 2022-04-24 ENCOUNTER — Encounter: Payer: Self-pay | Admitting: Sports Medicine

## 2022-04-24 DIAGNOSIS — M1612 Unilateral primary osteoarthritis, left hip: Secondary | ICD-10-CM

## 2022-04-24 MED ORDER — HYDROCODONE-ACETAMINOPHEN 5-325 MG PO TABS: 1.0000 | ORAL_TABLET | Freq: Three times a day (TID) | ORAL | 0 refills | Status: DC | PRN

## 2022-05-01 ENCOUNTER — Encounter: Payer: Self-pay | Admitting: Physician Assistant

## 2022-05-01 MED ORDER — DOXEPIN HCL 25 MG PO CAPS: ORAL_CAPSULE | ORAL | 3 refills | Status: DC

## 2022-05-02 DIAGNOSIS — M1612 Unilateral primary osteoarthritis, left hip: Secondary | ICD-10-CM | POA: Diagnosis not present

## 2022-05-03 DIAGNOSIS — N289 Disorder of kidney and ureter, unspecified: Secondary | ICD-10-CM | POA: Diagnosis not present

## 2022-05-04 DIAGNOSIS — J3089 Other allergic rhinitis: Secondary | ICD-10-CM | POA: Diagnosis not present

## 2022-05-04 DIAGNOSIS — G4733 Obstructive sleep apnea (adult) (pediatric): Secondary | ICD-10-CM | POA: Diagnosis not present

## 2022-05-04 LAB — COMPLETE METABOLIC PANEL WITH GFR
AG Ratio: 1.8 (calc) (ref 1.0–2.5)
ALT: 14 U/L (ref 9–46)
AST: 16 U/L (ref 10–35)
Albumin: 4.1 g/dL (ref 3.6–5.1)
Alkaline phosphatase (APISO): 36 U/L (ref 35–144)
BUN/Creatinine Ratio: 14 (calc) (ref 6–22)
BUN: 22 mg/dL (ref 7–25)
CO2: 27 mmol/L (ref 20–32)
Calcium: 9.3 mg/dL (ref 8.6–10.3)
Chloride: 105 mmol/L (ref 98–110)
Creat: 1.59 mg/dL — ABNORMAL HIGH (ref 0.70–1.28)
Globulin: 2.3 g/dL (calc) (ref 1.9–3.7)
Glucose, Bld: 99 mg/dL (ref 65–99)
Potassium: 4.5 mmol/L (ref 3.5–5.3)
Sodium: 142 mmol/L (ref 135–146)
Total Bilirubin: 0.5 mg/dL (ref 0.2–1.2)
Total Protein: 6.4 g/dL (ref 6.1–8.1)
eGFR: 46 mL/min/{1.73_m2} — ABNORMAL LOW (ref >=60)

## 2022-05-05 NOTE — Progress Notes (Signed)
Kidney function stable from 1 month ago. Kidneys still look a little dry. How much are you drinking a day?

## 2022-05-07 DIAGNOSIS — G4733 Obstructive sleep apnea (adult) (pediatric): Secondary | ICD-10-CM | POA: Diagnosis not present

## 2022-05-18 ENCOUNTER — Telehealth: Payer: Self-pay | Admitting: Physician Assistant

## 2022-05-18 NOTE — Telephone Encounter (Signed)
[facility] called about a clearance form that was faxed over 05-04-22 they would like the status please [phone]

## 2022-05-22 ENCOUNTER — Encounter: Payer: Self-pay | Admitting: Physician Assistant

## 2022-05-23 DIAGNOSIS — J3089 Other allergic rhinitis: Secondary | ICD-10-CM | POA: Diagnosis not present

## 2022-05-23 DIAGNOSIS — G4733 Obstructive sleep apnea (adult) (pediatric): Secondary | ICD-10-CM | POA: Diagnosis not present

## 2022-05-23 NOTE — Telephone Encounter (Signed)
Preop paperwork filled out  [facility] to check labs UTD EKG 08/2021 unchanged

## 2022-05-30 DIAGNOSIS — G4733 Obstructive sleep apnea (adult) (pediatric): Secondary | ICD-10-CM | POA: Diagnosis not present

## 2022-05-31 ENCOUNTER — Telehealth: Payer: Self-pay | Admitting: Physician Assistant

## 2022-05-31 NOTE — Telephone Encounter (Signed)
Patient called stating the [facility] never received the medical clearance paperwork. The patient and his wife were hoping to pick up the paperwork and drop it off at [facility] themselves.  Please give them an update as soon as possible. Thank you.  [phone]

## 2022-06-06 ENCOUNTER — Other Ambulatory Visit: Payer: Self-pay | Admitting: Orthopedic Surgery

## 2022-06-06 DIAGNOSIS — G4733 Obstructive sleep apnea (adult) (pediatric): Secondary | ICD-10-CM | POA: Diagnosis not present

## 2022-06-08 DIAGNOSIS — M1612 Unilateral primary osteoarthritis, left hip: Secondary | ICD-10-CM | POA: Diagnosis not present

## 2022-06-08 DIAGNOSIS — R262 Difficulty in walking, not elsewhere classified: Secondary | ICD-10-CM | POA: Diagnosis not present

## 2022-06-08 DIAGNOSIS — M25652 Stiffness of left hip, not elsewhere classified: Secondary | ICD-10-CM | POA: Diagnosis not present

## 2022-06-12 ENCOUNTER — Ambulatory Visit
Admission: EM | Admit: 2022-06-12 | Discharge: 2022-06-12 | Disposition: A | Discharge status: Home / Self Care | Payer: Medicare Other | Attending: Family Medicine | Admitting: Family Medicine

## 2022-06-12 ENCOUNTER — Encounter: Payer: Self-pay | Admitting: Emergency Medicine

## 2022-06-12 DIAGNOSIS — J0100 Acute maxillary sinusitis, unspecified: Secondary | ICD-10-CM

## 2022-06-12 DIAGNOSIS — R0981 Nasal congestion: Secondary | ICD-10-CM

## 2022-06-12 MED ORDER — AMOXICILLIN-POT CLAVULANATE 875-125 MG PO TABS: 1.0000 | ORAL_TABLET | Freq: Two times a day (BID) | ORAL | 0 refills | Status: AC

## 2022-06-12 MED ORDER — PREDNISONE 20 MG PO TABS: ORAL_TABLET | ORAL | 0 refills | Status: DC

## 2022-06-12 NOTE — ED Triage Notes (Signed)
Patient c/o cough x 3 days.  Some productive cough, nasal drainage, congestion comes and goes.  Patient has taken Mucinex DM.

## 2022-06-12 NOTE — ED Provider Notes (Signed)
[facility]    CSN: [id number] Arrival date & time: 06/12/22  1255      History   Chief Complaint Chief Complaint  Patient presents with   Cough    HPI Randall Hanson is a 72 y.o. male.   HPI  Pleasant 72 year old male presents with sinus nasal congestion/pressure for 10 days.  Days.  Patient is accompanied by his wife this afternoon.  PMH significant for HTN, prostate cancer, and sleep apnea.  Past Medical History:  Diagnosis Date   Arthritis    Colon polyps    High cholesterol    Hypertension    Joint pain    Prostate cancer (HCC)    Sleep apnea     Patient Active Problem List   Diagnosis Date Noted   Weight gain 02/24/2022   Hypertension goal BP (blood pressure) < 130/80 10/11/2021   History of suicide attempt 09/27/2021   MDD (major depressive disorder), recurrent episode, moderate ([facility]) 09/05/2021   Suicide attempt by multiple drug overdose ([facility]) 09/04/2021   AKI (acute kidney injury) ([facility]) 09/04/2021   Aspiration pneumonitis ([facility]) 09/04/2021   Depressed mood 02/23/2021   Chronic pain syndrome 02/23/2021   Vitamin D insufficiency 12/22/2020   Aortic atherosclerosis ([facility]) 09/21/2020   OSA (obstructive sleep apnea) 09/16/2020   Sensorineural hearing loss (SNHL), bilateral 09/16/2020   Insomnia 09/16/2020   Primary osteoarthritis of left hip 09/16/2020   Lumbar spondylosis 09/16/2020   Anxiety disorder, unspecified 09/16/2020   Benign essential hypertension 08/02/2020   Prediabetes 11/17/2019   Combined forms of age-related cataract of left eye 07/24/2019   Personal history of pulmonary embolism 05/22/2018   Prostate cancer (HCC) 05/30/2016   Vitamin B 12 deficiency 10/23/2015   Diverticulosis of large intestine without hemorrhage 06/21/2015   Tubular adenoma of colon 06/21/2015   History of total knee arthroplasty, right 06/01/2014   Dyslipidemia 02/15/2014   ED (erectile dysfunction) 12/29/2011    Past Surgical History:  Procedure  Laterality Date   LASIK Bilateral    PROSTATECTOMY     REPLACEMENT TOTAL KNEE     SHOULDER SURGERY         Home Medications    Prior to Admission medications   Medication Sig Start Date End Date Taking? Authorizing Provider  amoxicillin-clavulanate (AUGMENTIN) 875-125 MG tablet Take 1 tablet by mouth 2 (two) times daily for 10 days. 06/12/22 06/22/22 Yes Trevor Iha, FNP  clonazePAM (KLONOPIN) 0.5 MG tablet Take 1 tablet (0.5 mg total) by mouth 2 (two) times daily as needed. for anxiety/sleep. 02/24/22  Yes Breeback, Jade L, PA-C  diclofenac (VOLTAREN) 75 MG EC tablet Take 1 tablet (75 mg total) by mouth 2 (two) times daily. 12/27/21 12/27/22 Yes Monica Becton, MD  doxepin (SINEQUAN) 25 MG capsule One or two at bedtime. 05/01/22  Yes Breeback, Jade L, PA-C  DULoxetine (CYMBALTA) 30 MG capsule Take 1 capsule (30 mg total) by mouth daily after breakfast. 09/10/21  Yes Sarina Ill, DO  hydrochlorothiazide (HYDRODIURIL) 12.5 MG tablet Take 1 tablet (12.5 mg total) by mouth daily. 02/24/22  Yes Breeback, Jade L, PA-C  HYDROcodone-acetaminophen (NORCO/VICODIN) 5-325 MG tablet Take 1 tablet by mouth every 8 (eight) hours as needed for moderate pain. 04/24/22  Yes Monica Becton, MD  lisinopril (ZESTRIL) 40 MG tablet Take 1 tablet (40 mg total) by mouth daily. 02/24/22  Yes Breeback, Jade L, PA-C  metFORMIN (GLUCOPHAGE) 500 MG tablet Take 1 tablet (500 mg total) by mouth 2 (two) times daily  with a meal. 02/24/22  Yes Breeback, Jade L, PA-C  predniSONE (DELTASONE) 20 MG tablet Take 3 tabs PO daily x 5 days. 06/12/22  Yes Trevor Iha, FNP  simvastatin (ZOCOR) 40 MG tablet Take 1 tablet (40 mg total) by mouth daily. 12/05/21  Yes Breeback, Jade L, PA-C  vitamin B-12 (CYANOCOBALAMIN) 500 MCG tablet Take 500 mcg by mouth daily.   Yes [provider]    Family History Family History  Problem Relation Age of Onset   Cancer Father     Social History Social History    Tobacco Use   Smoking status: Former    Types: Cigarettes   Smokeless tobacco: Never  Vaping Use   Vaping Use: Never used  Substance Use Topics   Alcohol use: Yes    Comment: 1 standard drink 2x monthly   Drug use: Never     Allergies   Mirtazapine and Trazodone and nefazodone   Review of Systems Review of Systems  HENT:  Positive for congestion.   Respiratory:  Positive for cough.      Physical Exam Triage Vital Signs ED Triage Vitals  Enc Vitals Group     BP      Pulse      Resp      Temp      Temp src      SpO2      Weight      Height      Head Circumference      Peak Flow      Pain Score      Pain Loc      Pain Edu?      Excl. in GC?    No data found.  Updated Vital Signs BP 131/73 (BP Location: Left Arm)   Pulse 73   Temp 98.1 F (36.7 C) (Oral)   Resp 16   Ht 5\' 10"  (1.778 m)   Wt 215 lb (97.5 kg)   SpO2 98%   BMI 30.85 kg/m   Visual Acuity Right Eye Distance:   Left Eye Distance:   Bilateral Distance:    Right Eye Near:   Left Eye Near:    Bilateral Near:     Physical Exam Vitals and nursing note reviewed.  Constitutional:      Appearance: Normal appearance. He is normal weight. He is ill-appearing.  HENT:     Head: Normocephalic and atraumatic.     Right Ear: External ear normal.     Left Ear: External ear normal.     Ears:     Comments: Significant eustachian tube noted bilaterally, TM's, are clear, retracted    Mouth/Throat:     Mouth: Mucous membranes are moist.     Pharynx: Oropharynx is clear.  Eyes:     Extraocular Movements: Extraocular movements intact.     Conjunctiva/sclera: Conjunctivae normal.     Pupils: Pupils are equal, round, and reactive to light.  Cardiovascular:     Rate and Rhythm: Normal rate and regular rhythm.     Pulses: Normal pulses.     Heart sounds: Normal heart sounds.  Pulmonary:     Effort: Pulmonary effort is normal.     Breath sounds: Normal breath sounds. No wheezing, rhonchi or  rales.  Musculoskeletal:        General: Normal range of motion.     Cervical back: Normal range of motion and neck supple.  Skin:    General: Skin is warm and dry.  Neurological:  General: No focal deficit present.     Mental Status: He is alert and oriented to person, place, and time. Mental status is at baseline.  Psychiatric:        Mood and Affect: Mood normal.        Behavior: Behavior normal.      UC Treatments / Results  Labs (all labs ordered are listed, but only abnormal results are displayed) Labs Reviewed - No data to display  EKG   Radiology No results found.  Procedures Procedures (including critical care time)  Medications Ordered in UC Medications - No data to display  Initial Impression / Assessment and Plan / UC Course  I have reviewed the triage vital signs and the nursing notes.  Pertinent labs & imaging results that were available during my care of the patient were reviewed by me and considered in my medical decision making (see chart for details).     MDM: 1.  Acute maxillary sinusitis, recurrence not specified-Rx'd Augmentin 875/125 mg tablet twice daily x 10 days; 2.  Congestion of nasal sinus-Rx'd prednisone 60 mg daily x 5 days. Instructed patient to take medication as directed with food to completion.  Advised patient to take prednisone with first dose of Augmentin for the next 5 of 10 days.  Encouraged increase daily water intake to 64 ounces per day while taking these medications.  Advised if symptoms worsen and/or unresolved please follow-up with PCP or here for further evaluation.  Patient discharged home, hemodynamically stable.  Final Clinical Impressions(s) / UC Diagnoses   Final diagnoses:  Acute maxillary sinusitis, recurrence not specified  Congestion of nasal sinus     Discharge Instructions      Instructed patient to take medication as directed with food to completion.  Advised patient to take prednisone with first dose of  Augmentin for the next 5 of 10 days.  Encouraged increase daily water intake to 64 ounces per day while taking these medications.  Advised if symptoms worsen and/or unresolved please follow-up with PCP or here for further evaluation.     ED Prescriptions     Medication Sig Dispense Auth. Provider   amoxicillin-clavulanate (AUGMENTIN) 875-125 MG tablet Take 1 tablet by mouth 2 (two) times daily for 10 days. 20 tablet Trevor Iha, FNP   predniSONE (DELTASONE) 20 MG tablet Take 3 tabs PO daily x 5 days. 15 tablet Trevor Iha, FNP      PDMP not reviewed this encounter.   Trevor Iha, FNP 06/12/22 1356

## 2022-06-12 NOTE — Discharge Instructions (Addendum)
Instructed patient to take medication as directed with food to completion.  Advised patient to take prednisone with first dose of Augmentin for the next 5 of 10 days.  Encouraged increase daily water intake to 64 ounces per day while taking these medications.  Advised if symptoms worsen and/or unresolved please follow-up with PCP or here for further evaluation. 

## 2022-06-14 ENCOUNTER — Encounter (HOSPITAL_COMMUNITY): Payer: Self-pay

## 2022-06-15 DIAGNOSIS — M25552 Pain in left hip: Secondary | ICD-10-CM | POA: Diagnosis not present

## 2022-06-15 DIAGNOSIS — M1612 Unilateral primary osteoarthritis, left hip: Secondary | ICD-10-CM | POA: Diagnosis not present

## 2022-06-17 NOTE — Progress Notes (Signed)
COVID Vaccine received:  []  No [x]  Yes Date of any COVID positive Test in last 90 days:  PCP - Tandy Gaw, PA-C  [phone]  F) [phone] Cardiologist - none Pulmonology- Camelia Eng NP at [facility]  Chest x-ray - order for PST EKG -  09-05-2021  Epic Stress Test -  ECHO -  Cardiac Cath -   PCR screen: [x]  Ordered & Completed           []   No Order but Needs PROFEND           []   N/A for this surgery  Surgery Plan:  [x]  Ambulatory                            []  Outpatient in bed                            []  Admit  Anesthesia:    []  General  [x]  Spinal                           []   Choice []   MAC  Pacemaker / ICD device [x]  No []  Yes   Spinal Cord Stimulator:[x]  No []  Yes       History of Sleep Apnea? []  No [x]  Yes   CPAP used?- []  No []  Yes    Does the patient monitor blood sugar?          []  No []  Yes  []  N/A Last A1c:  11-11-2019 was 5.7   Patient has: []  NO Hx DM   [x]  Pre-DM                 []  DM1  []   DM2 Does patient have a [employer] or Dexacom? []  No []  Yes   Fasting Blood Sugar Ranges-  Checks Blood Sugar _____ times a day  Blood Thinner / Instructions: None Aspirin Instructions: None  ERAS Protocol Ordered: []  No  [x]  Yes PRE-SURGERY []  ENSURE  [x]  G2  Patient is to be NPO after: 04:30 am  Patient was given the 5 CHG shower / bath instructions for THA / TKA / Total or Reverse Shoulder arthroplasty surgery along with 2 bottles of the CHG soap. Patient will start this on: THursday  June 22, 2022   All questions were asked and answered, Patient voiced understanding of this process.   Activity level: Patient is able / unable to climb a flight of stairs without difficulty; []  No CP  []  No SOB, but would have ___   Patient can / can not perform ADLs without assistance.   Anesthesia review: OSA ?CPAP, HTN, Pre-DM (Diet only),HOH, anxiety, CRPS- hx suicide attempt- overdose.  CKD3  Patient denies shortness of breath, fever,  cough and chest pain at PAT appointment.  Patient verbalized understanding and agreement to the Pre-Surgical Instructions that were given to them at this PAT appointment. Patient was also educated of the need to review these PAT instructions again prior to his/her surgery.I reviewed the appropriate phone numbers to call if they have any and questions or concerns.

## 2022-06-17 NOTE — Patient Instructions (Signed)
SURGICAL WAITING ROOM VISITATION Patients having surgery or a procedure may have no more than 2 support people in the waiting area - these visitors may rotate in the visitor waiting room.   Due to an increase in RSV and influenza rates and associated hospitalizations, children ages 75 and under may not visit patients in [facility] hospitals. If the patient needs to stay at the hospital during part of their recovery, the visitor guidelines for inpatient rooms apply.  PRE-OP VISITATION  Pre-op nurse will coordinate an appropriate time for 1 support person to accompany the patient in pre-op.  This support person may not rotate.  This visitor will be contacted when the time is appropriate for the visitor to come back in the pre-op area.  Please refer to the [facility] website for the visitor guidelines for Inpatients (after your surgery is over and you are in a regular room).  You are not required to quarantine at this time prior to your surgery. However, you must do this: Hand Hygiene often Do NOT share personal items Notify your provider if you are in close contact with someone who has COVID or you develop fever 100.4 or greater, new onset of sneezing, cough, sore throat, shortness of breath or body aches.  If you test positive for Covid or have been in contact with anyone that has tested positive in the last 10 days please notify you surgeon.    Your procedure is scheduled on:  Monday   June 26, 2022   Report to [facility] Main Entrance: [place] entrance where the [employer] is available.   Report to admitting at:  05:15   AM  +++++Call this number if you have any questions or problems the morning of surgery [phone]  Do not eat food after Midnight the night prior to your surgery/procedure.  After Midnight you may have the following liquids until   04:30 AM DAY OF SURGERY  Clear Liquid Diet Water Black Coffee (sugar ok, NO MILK/CREAM OR CREAMERS)  Tea (sugar ok, NO  MILK/CREAM OR CREAMERS) regular and decaf                             Plain Jell-O  with no fruit (NO RED)                                           Fruit ices (not with fruit pulp, NO RED)                                     Popsicles (NO RED)                                                                  Juice: apple, WHITE grape, WHITE cranberry Sports drinks like Gatorade or Powerade (NO RED)                     The day of surgery:  Drink ONE (1) Pre-Surgery  G2 at 04:30   AM the morning of  surgery. Drink in one sitting. Do not sip.  This drink was given to you during your hospital pre-op appointment visit. Nothing else to drink after completing the Pre-Surgery G2 : No candy, chewing gum or throat lozenges.    FOLLOW ANY ADDITIONAL PRE OP INSTRUCTIONS YOU RECEIVED FROM YOUR SURGEON'S OFFICE!!!   Oral Hygiene is also important to reduce your risk of infection.        Remember - BRUSH YOUR TEETH THE MORNING OF SURGERY WITH YOUR REGULAR TOOTHPASTE  Do NOT smoke after Midnight the night before surgery.  Take ONLY these medicines the morning of surgery with A SIP OF WATER:  Duloxetine (Cymbalta),  Tramadol if needed for Moderate pain   If You have been diagnosed with Sleep Apnea - Bring CPAP mask and tubing day of surgery. We will provide you with a CPAP machine on the day of your surgery.                   You may not have any metal on your body including  jewelry, and body piercing  Do not wear lotions, powders, cologne, or deodorant  Men may shave face and neck.  Contacts, Hearing Aids, dentures or bridgework may not be worn into surgery. DENTURES WILL BE REMOVED PRIOR TO SURGERY PLEASE DO NOT APPLY "Poly grip" OR ADHESIVES!!!  Patients discharged on the day of surgery will not be allowed to drive home.  Someone NEEDS to stay with you for the first 24 hours after anesthesia.  Do not bring your home medications to the hospital. The Pharmacy will dispense medications listed  on your medication list to you during your admission in the Hospital.  Special Instructions: Bring a copy of your healthcare power of attorney and living will documents the day of surgery, if you wish to have them scanned into your [facility] Records- EPIC  Please read over the following fact sheets you were given: IF YOU HAVE QUESTIONS ABOUT YOUR PRE-OP INSTRUCTIONS, PLEASE CALL [phone].   [place] - Preparing for Surgery Before surgery, you can play an important role.  Because skin is not sterile, your skin needs to be as free of germs as possible.  You can reduce the number of germs on your skin by washing with CHG (chlorahexidine gluconate) soap before surgery.  CHG is an antiseptic cleaner which kills germs and bonds with the skin to continue killing germs even after washing. Please DO NOT use if you have an allergy to CHG or antibacterial soaps.  If your skin becomes reddened/irritated stop using the CHG and inform your nurse when you arrive at Short Stay. Do not shave (including legs and underarms) for at least 48 hours prior to the first CHG shower.  You may shave your face/neck.  Please follow these instructions carefully:  1.  Shower with CHG Soap the night before surgery and the  morning of surgery.  2.  If you choose to wash your hair, wash your hair first as usual with your normal  shampoo.  3.  After you shampoo, rinse your hair and body thoroughly to remove the shampoo.                             4.  Use CHG as you would any other liquid soap.  You can apply chg directly to the skin and wash.  Gently with a scrungie or clean washcloth.  5.  Apply the CHG Soap  to your body ONLY FROM THE NECK DOWN.   Do not use on face/ open                           Wound or open sores. Avoid contact with eyes, ears mouth and genitals (private parts).                       Wash face,  Genitals (private parts) with your normal soap.             6.  Wash thoroughly, paying special  attention to the area where your  surgery  will be performed.  7.  Thoroughly rinse your body with warm water from the neck down.  8.  DO NOT shower/wash with your normal soap after using and rinsing off the CHG Soap.            9.  Pat yourself dry with a clean towel.            10.  Wear clean pajamas.            11.  Place clean sheets on your bed the night of your first shower and do not  sleep with pets.  ON THE DAY OF SURGERY : Do not apply any lotions/deodorants the morning of surgery.  Please wear clean clothes to the hospital/surgery center.    FAILURE TO FOLLOW THESE INSTRUCTIONS MAY RESULT IN THE CANCELLATION OF YOUR SURGERY  PATIENT SIGNATURE_________________________________  NURSE SIGNATURE__________________________________  ________________________________________________________________________      Randall Hanson    An incentive spirometer is a tool that can help keep your lungs clear and active. This tool measures how well you are filling your lungs with each breath. Taking long deep breaths may help reverse or decrease the chance of developing breathing (pulmonary) problems (especially infection) following: A long period of time when you are unable to move or be active. BEFORE THE PROCEDURE  If the spirometer includes an indicator to show your best effort, your nurse or respiratory therapist will set it to a desired goal. If possible, sit up straight or lean slightly forward. Try not to slouch. Hold the incentive spirometer in an upright position. INSTRUCTIONS FOR USE  Sit on the edge of your bed if possible, or sit up as far as you can in bed or on a chair. Hold the incentive spirometer in an upright position. Breathe out normally. Place the mouthpiece in your mouth and seal your lips tightly around it. Breathe in slowly and as deeply as possible, raising the piston or the ball toward the top of the column. Hold your breath for 3-5 seconds or for as  long as possible. Allow the piston or ball to fall to the bottom of the column. Remove the mouthpiece from your mouth and breathe out normally. Rest for a few seconds and repeat Steps 1 through 7 at least 10 times every 1-2 hours when you are awake. Take your time and take a few normal breaths between deep breaths. The spirometer may include an indicator to show your best effort. Use the indicator as a goal to work toward during each repetition. After each set of 10 deep breaths, practice coughing to be sure your lungs are clear. If you have an incision (the cut made at the time of surgery), support your incision when coughing by placing a pillow or rolled up towels firmly against it. Once you are able  to get out of bed, walk around indoors and cough well. You may stop using the incentive spirometer when instructed by your caregiver.  RISKS AND COMPLICATIONS Take your time so you do not get dizzy or light-headed. If you are in pain, you may need to take or ask for pain medication before doing incentive spirometry. It is harder to take a deep breath if you are having pain. AFTER USE Rest and breathe slowly and easily. It can be helpful to keep track of a log of your progress. Your caregiver can provide you with a simple table to help with this. If you are using the spirometer at home, follow these instructions: SEEK MEDICAL CARE IF:  You are having difficultly using the spirometer. You have trouble using the spirometer as often as instructed. Your pain medication is not giving enough relief while using the spirometer. You develop fever of 100.5 F (38.1 C) or higher.                                                                                                    SEEK IMMEDIATE MEDICAL CARE IF:  You cough up bloody sputum that had not been present before. You develop fever of 102 F (38.9 C) or greater. You develop worsening pain at or near the incision site. MAKE SURE YOU:  Understand these  instructions. Will watch your condition. Will get help right away if you are not doing well or get worse. Document Released: 05/15/2006 Document Revised: 03/27/2011 Document Reviewed: 07/16/2006     [facility] Patient Information 2014 [place], [place].

## 2022-06-20 ENCOUNTER — Encounter (HOSPITAL_COMMUNITY)
Admission: RE | Admit: 2022-06-20 | Discharge: 2022-06-20 | Disposition: A | Discharge status: Home / Self Care | Payer: Medicare Other | Source: Ambulatory Visit | Attending: Orthopedic Surgery | Admitting: Orthopedic Surgery

## 2022-06-20 ENCOUNTER — Encounter (HOSPITAL_COMMUNITY): Payer: Self-pay

## 2022-06-20 ENCOUNTER — Ambulatory Visit (HOSPITAL_COMMUNITY)
Admission: RE | Admit: 2022-06-20 | Discharge: 2022-06-20 | Disposition: A | Discharge status: Home / Self Care | Payer: Medicare Other | Source: Ambulatory Visit | Attending: Orthopedic Surgery | Admitting: Orthopedic Surgery

## 2022-06-20 ENCOUNTER — Other Ambulatory Visit: Payer: Self-pay

## 2022-06-20 VITALS — BP 107/66 | HR 78 | Temp 98.7°F | Resp 18 | Ht 70.0 in | Wt 214.0 lb

## 2022-06-20 DIAGNOSIS — Z01818 Encounter for other preprocedural examination: Secondary | ICD-10-CM | POA: Insufficient documentation

## 2022-06-20 DIAGNOSIS — I10 Essential (primary) hypertension: Secondary | ICD-10-CM | POA: Insufficient documentation

## 2022-06-20 DIAGNOSIS — R7303 Prediabetes: Secondary | ICD-10-CM | POA: Diagnosis not present

## 2022-06-20 DIAGNOSIS — Z79891 Long term (current) use of opiate analgesic: Secondary | ICD-10-CM | POA: Diagnosis not present

## 2022-06-20 HISTORY — DX: Prediabetes: R73.03

## 2022-06-20 HISTORY — DX: Unspecified hearing loss, unspecified ear: H91.90

## 2022-06-20 HISTORY — DX: Anxiety disorder, unspecified: F41.9

## 2022-06-20 HISTORY — DX: Personal history of urinary calculi: Z87.442

## 2022-06-20 LAB — COMPREHENSIVE METABOLIC PANEL
ALT: 20 U/L (ref 0–44)
AST: 16 U/L (ref 15–41)
Albumin: 3.6 g/dL (ref 3.5–5.0)
Alkaline Phosphatase: 46 U/L (ref 38–126)
Anion gap: 8 (ref 5–15)
BUN: 19 mg/dL (ref 8–23)
CO2: 26 mmol/L (ref 22–32)
Calcium: 8.9 mg/dL (ref 8.9–10.3)
Chloride: 101 mmol/L (ref 98–111)
Creatinine, Ser: 1.43 mg/dL — ABNORMAL HIGH (ref 0.61–1.24)
GFR, Estimated: 53 mL/min — ABNORMAL LOW (ref >60)
Glucose, Bld: 120 mg/dL — ABNORMAL HIGH (ref 70–99)
Potassium: 3.8 mmol/L (ref 3.5–5.1)
Sodium: 135 mmol/L (ref 135–145)
Total Bilirubin: 0.8 mg/dL (ref 0.3–1.2)
Total Protein: 6.5 g/dL (ref 6.5–8.1)

## 2022-06-20 LAB — TYPE AND SCREEN: ABO/RH(D): A POS

## 2022-06-20 LAB — CBC
HCT: 38.1 % — ABNORMAL LOW (ref 39.0–52.0)
Hemoglobin: 12.5 g/dL — ABNORMAL LOW (ref 13.0–17.0)
MCH: 31.5 pg (ref 26.0–34.0)
MCHC: 32.8 g/dL (ref 30.0–36.0)
MCV: 96 fL (ref 80.0–100.0)
Platelets: 285 10*3/uL (ref 150–400)
RBC: 3.97 MIL/uL — ABNORMAL LOW (ref 4.22–5.81)
RDW: 13.1 % (ref 11.5–15.5)
WBC: 11 10*3/uL — ABNORMAL HIGH (ref 4.0–10.5)
nRBC: 0 % (ref 0.0–0.2)

## 2022-06-20 LAB — SURGICAL PCR SCREEN
MRSA, PCR: NEGATIVE
Staphylococcus aureus: NEGATIVE

## 2022-06-20 LAB — GLUCOSE, CAPILLARY: Glucose-Capillary: 118 mg/dL — ABNORMAL HIGH (ref 70–99)

## 2022-06-21 LAB — HEMOGLOBIN A1C
Hgb A1c MFr Bld: 6.2 % — ABNORMAL HIGH (ref 4.8–5.6)
Mean Plasma Glucose: 131 mg/dL

## 2022-06-25 NOTE — Anesthesia Preprocedure Evaluation (Signed)
Anesthesia Evaluation  Patient identified by MRN, date of birth, ID band Patient awake    Reviewed: Allergy & Precautions, NPO status , Patient's Chart, lab work & pertinent test results  Airway Mallampati: I  TM Distance: >3 FB Neck ROM: Full    Dental  (+) Dental Advisory Given, Missing,    Pulmonary sleep apnea and Continuous Positive Airway Pressure Ventilation    Pulmonary exam normal breath sounds clear to auscultation       Cardiovascular hypertension, Pt. on medications Normal cardiovascular exam Rhythm:Regular Rate:Normal     Neuro/Psych  PSYCHIATRIC DISORDERS Anxiety Depression    negative neurological ROS     GI/Hepatic negative GI ROS, Neg liver ROS,,,  Endo/Other  negative endocrine ROS    Renal/GU negative Renal ROS  negative genitourinary   Musculoskeletal  (+) Arthritis ,    Abdominal   Peds  Hematology negative hematology ROS (+)   Anesthesia Other Findings   Reproductive/Obstetrics                             Anesthesia Physical Anesthesia Plan  ASA: 3  Anesthesia Plan: Spinal   Post-op Pain Management: Tylenol PO (pre-op)*   Induction:   PONV Risk Score and Plan: Treatment may vary due to age or medical condition, Propofol infusion, Dexamethasone and Ondansetron  Airway Management Planned: Natural Airway  Additional Equipment:   Intra-op Plan:   Post-operative Plan:   Informed Consent: I have reviewed the patients History and Physical, chart, labs and discussed the procedure including the risks, benefits and alternatives for the proposed anesthesia with the patient or authorized representative who has indicated his/her understanding and acceptance.     Dental advisory given  Plan Discussed with: CRNA  Anesthesia Plan Comments:        Anesthesia Quick Evaluation

## 2022-06-25 NOTE — Care Plan (Signed)
Ortho Bundle Case Management Note  Patient Details  Name: Randall Hanson MRN: [id number] Date of Birth: [birth year]    met with patient and wife in the office for H&P. he will discharge to home with her assistance. has DME at home OPPT set up with [facility]- Kathryne Sharper. discharge instructions discussed and questions answered. appointments confirmed                 DME Arranged:    DME Agency:  [employer]  HH Arranged:    HH Agency:     Additional Comments: Please contact me with any questions of if this plan should need to change.  Shauna Hugh,  RN,BSN,MHA,CCM  [facility] Orthopaedic Specialist  [phone] 06/25/2022, 9:51 PM

## 2022-06-26 ENCOUNTER — Other Ambulatory Visit: Payer: Self-pay

## 2022-06-26 ENCOUNTER — Ambulatory Visit (HOSPITAL_COMMUNITY)
Admission: RE | Admit: 2022-06-26 | Discharge: 2022-06-26 | Disposition: A | Discharge status: Home / Self Care | Payer: Medicare Other | Source: Ambulatory Visit | Attending: Orthopedic Surgery | Admitting: Orthopedic Surgery

## 2022-06-26 ENCOUNTER — Ambulatory Visit (HOSPITAL_BASED_OUTPATIENT_CLINIC_OR_DEPARTMENT_OTHER): Payer: Medicare Other | Admitting: Anesthesiology

## 2022-06-26 ENCOUNTER — Encounter (HOSPITAL_COMMUNITY): Payer: Self-pay | Admitting: Orthopedic Surgery

## 2022-06-26 ENCOUNTER — Ambulatory Visit (HOSPITAL_COMMUNITY): Payer: Medicare Other

## 2022-06-26 ENCOUNTER — Ambulatory Visit (HOSPITAL_COMMUNITY): Payer: Medicare Other | Admitting: Anesthesiology

## 2022-06-26 ENCOUNTER — Encounter (HOSPITAL_COMMUNITY)
Admission: RE | Disposition: A | Discharge status: Home / Self Care | Payer: Self-pay | Source: Ambulatory Visit | Attending: Orthopedic Surgery

## 2022-06-26 DIAGNOSIS — I10 Essential (primary) hypertension: Secondary | ICD-10-CM | POA: Insufficient documentation

## 2022-06-26 DIAGNOSIS — Z9989 Dependence on other enabling machines and devices: Secondary | ICD-10-CM | POA: Diagnosis not present

## 2022-06-26 DIAGNOSIS — M1612 Unilateral primary osteoarthritis, left hip: Secondary | ICD-10-CM | POA: Insufficient documentation

## 2022-06-26 DIAGNOSIS — G4733 Obstructive sleep apnea (adult) (pediatric): Secondary | ICD-10-CM

## 2022-06-26 DIAGNOSIS — C61 Malignant neoplasm of prostate: Secondary | ICD-10-CM | POA: Insufficient documentation

## 2022-06-26 DIAGNOSIS — Z96642 Presence of left artificial hip joint: Secondary | ICD-10-CM | POA: Diagnosis not present

## 2022-06-26 DIAGNOSIS — R7303 Prediabetes: Secondary | ICD-10-CM

## 2022-06-26 DIAGNOSIS — Z01818 Encounter for other preprocedural examination: Secondary | ICD-10-CM

## 2022-06-26 HISTORY — PX: TOTAL HIP ARTHROPLASTY: SHX124

## 2022-06-26 LAB — TYPE AND SCREEN: Antibody Screen: NEGATIVE

## 2022-06-26 LAB — ABO/RH: ABO/RH(D): A POS

## 2022-06-26 SURGERY — ARTHROPLASTY, HIP, TOTAL, ANTERIOR APPROACH
Anesthesia: Spinal | Site: Hip | Laterality: Left

## 2022-06-26 MED ORDER — ACETAMINOPHEN 325 MG PO TABS: 325.0000 mg | ORAL_TABLET | Freq: Four times a day (QID) | ORAL | Status: DC | PRN

## 2022-06-26 MED ORDER — LACTATED RINGERS IV BOLUS: 250.0000 mL | Freq: Once | INTRAVENOUS | Status: DC

## 2022-06-26 MED ORDER — CELECOXIB 200 MG PO CAPS: 200.0000 mg | ORAL_CAPSULE | Freq: Two times a day (BID) | ORAL | Status: DC

## 2022-06-26 MED ORDER — ASPIRIN 325 MG PO TBEC: 325.0000 mg | DELAYED_RELEASE_TABLET | Freq: Two times a day (BID) | ORAL | 0 refills | Status: AC

## 2022-06-26 MED ORDER — FENTANYL CITRATE (PF) 100 MCG/2ML IJ SOLN
INTRAMUSCULAR | Status: DC | PRN
  Administered 2022-06-26: 25 ug via INTRAVENOUS
  Administered 2022-06-26: 50 ug via INTRAVENOUS
  Administered 2022-06-26: 25 ug via INTRAVENOUS

## 2022-06-26 MED ORDER — FENTANYL CITRATE PF 50 MCG/ML IJ SOSY: 25.0000 ug | PREFILLED_SYRINGE | INTRAMUSCULAR | Status: DC | PRN

## 2022-06-26 MED ORDER — 0.9 % SODIUM CHLORIDE (POUR BTL) OPTIME
TOPICAL | Status: DC | PRN
  Administered 2022-06-26: 1000 mL

## 2022-06-26 MED ORDER — FENTANYL CITRATE (PF) 100 MCG/2ML IJ SOLN
INTRAMUSCULAR | Status: AC
  Filled 2022-06-26: qty 2

## 2022-06-26 MED ORDER — CELECOXIB 200 MG PO CAPS: 200.0000 mg | ORAL_CAPSULE | Freq: Two times a day (BID) | ORAL | 0 refills | Status: DC

## 2022-06-26 MED ORDER — LACTATED RINGERS IV BOLUS
500.0000 mL | Freq: Once | INTRAVENOUS | Status: AC
  Administered 2022-06-26: 500 mL via INTRAVENOUS

## 2022-06-26 MED ORDER — CHLORHEXIDINE GLUCONATE 0.12 % MT SOLN
15.0000 mL | Freq: Once | OROMUCOSAL | Status: AC
  Administered 2022-06-26: 15 mL via OROMUCOSAL

## 2022-06-26 MED ORDER — SODIUM CHLORIDE 0.9 % IV SOLN: INTRAVENOUS | Status: DC

## 2022-06-26 MED ORDER — TRANEXAMIC ACID-NACL 1000-0.7 MG/100ML-% IV SOLN
1000.0000 mg | Freq: Once | INTRAVENOUS | Status: AC
  Administered 2022-06-26: 1000 mg via INTRAVENOUS

## 2022-06-26 MED ORDER — ACETAMINOPHEN 500 MG PO TABS
1000.0000 mg | ORAL_TABLET | Freq: Once | ORAL | Status: AC
  Administered 2022-06-26: 1000 mg via ORAL
  Filled 2022-06-26: qty 2

## 2022-06-26 MED ORDER — PHENYLEPHRINE HCL (PRESSORS) 10 MG/ML IV SOLN
INTRAVENOUS | Status: DC | PRN
  Administered 2022-06-26 (×2): 100 ug via INTRAVENOUS

## 2022-06-26 MED ORDER — BUPIVACAINE-EPINEPHRINE 0.25% -1:200000 IJ SOLN
INTRAMUSCULAR | Status: AC
  Filled 2022-06-26: qty 1

## 2022-06-26 MED ORDER — OXYCODONE HCL 5 MG PO TABS
ORAL_TABLET | ORAL | Status: AC
  Filled 2022-06-26: qty 1

## 2022-06-26 MED ORDER — POVIDONE-IODINE 10 % EX SWAB: 2.0000 | Freq: Once | CUTANEOUS | Status: DC

## 2022-06-26 MED ORDER — METHOCARBAMOL 500 MG IVPB - SIMPLE MED
INTRAVENOUS | Status: AC
  Filled 2022-06-26: qty 55

## 2022-06-26 MED ORDER — MIDAZOLAM HCL 2 MG/2ML IJ SOLN
INTRAMUSCULAR | Status: AC
  Filled 2022-06-26: qty 2

## 2022-06-26 MED ORDER — TRANEXAMIC ACID-NACL 1000-0.7 MG/100ML-% IV SOLN
1000.0000 mg | INTRAVENOUS | Status: AC
  Administered 2022-06-26: 1000 mg via INTRAVENOUS
  Filled 2022-06-26: qty 100

## 2022-06-26 MED ORDER — OXYCODONE-ACETAMINOPHEN 5-325 MG PO TABS: 1.0000 | ORAL_TABLET | Freq: Four times a day (QID) | ORAL | 0 refills | Status: DC | PRN

## 2022-06-26 MED ORDER — DEXMEDETOMIDINE HCL IN NACL 80 MCG/20ML IV SOLN
INTRAVENOUS | Status: DC | PRN
  Administered 2022-06-26: 8 ug via INTRAVENOUS

## 2022-06-26 MED ORDER — LIDOCAINE HCL (CARDIAC) PF 100 MG/5ML IV SOSY
PREFILLED_SYRINGE | INTRAVENOUS | Status: DC | PRN
  Administered 2022-06-26: 50 mg via INTRAVENOUS

## 2022-06-26 MED ORDER — HYDROMORPHONE HCL 1 MG/ML IJ SOLN: 0.5000 mg | INTRAMUSCULAR | Status: DC | PRN

## 2022-06-26 MED ORDER — CEFAZOLIN SODIUM-DEXTROSE 2-4 GM/100ML-% IV SOLN: 2.0000 g | Freq: Four times a day (QID) | INTRAVENOUS | Status: DC

## 2022-06-26 MED ORDER — PHENYLEPHRINE HCL-NACL 20-0.9 MG/250ML-% IV SOLN
INTRAVENOUS | Status: AC
  Filled 2022-06-26: qty 250

## 2022-06-26 MED ORDER — ONDANSETRON HCL 4 MG PO TABS: 4.0000 mg | ORAL_TABLET | Freq: Four times a day (QID) | ORAL | Status: DC | PRN

## 2022-06-26 MED ORDER — MIDAZOLAM HCL 5 MG/5ML IJ SOLN
INTRAMUSCULAR | Status: DC | PRN
  Administered 2022-06-26 (×2): 1 mg via INTRAVENOUS

## 2022-06-26 MED ORDER — EPHEDRINE 5 MG/ML INJ
INTRAVENOUS | Status: AC
  Filled 2022-06-26: qty 5

## 2022-06-26 MED ORDER — PHENYLEPHRINE 80 MCG/ML (10ML) SYRINGE FOR IV PUSH (FOR BLOOD PRESSURE SUPPORT)
PREFILLED_SYRINGE | INTRAVENOUS | Status: AC
  Filled 2022-06-26: qty 10

## 2022-06-26 MED ORDER — LIDOCAINE HCL (PF) 2 % IJ SOLN
INTRAMUSCULAR | Status: AC
  Filled 2022-06-26: qty 5

## 2022-06-26 MED ORDER — EPHEDRINE SULFATE (PRESSORS) 50 MG/ML IJ SOLN
INTRAMUSCULAR | Status: DC | PRN
  Administered 2022-06-26: 5 mg via INTRAVENOUS
  Administered 2022-06-26 (×3): 10 mg via INTRAVENOUS
  Administered 2022-06-26: 5 mg via INTRAVENOUS

## 2022-06-26 MED ORDER — PROPOFOL 10 MG/ML IV BOLUS
INTRAVENOUS | Status: DC | PRN
  Administered 2022-06-26: 10 mg via INTRAVENOUS

## 2022-06-26 MED ORDER — BUPIVACAINE LIPOSOME 1.3 % IJ SUSP
INTRAMUSCULAR | Status: AC
  Filled 2022-06-26: qty 20

## 2022-06-26 MED ORDER — DEXAMETHASONE SODIUM PHOSPHATE 10 MG/ML IJ SOLN
INTRAMUSCULAR | Status: AC
  Filled 2022-06-26: qty 1

## 2022-06-26 MED ORDER — ORAL CARE MOUTH RINSE: 15.0000 mL | Freq: Once | OROMUCOSAL | Status: AC

## 2022-06-26 MED ORDER — METHOCARBAMOL 500 MG PO TABS: 500.0000 mg | ORAL_TABLET | Freq: Four times a day (QID) | ORAL | Status: DC | PRN

## 2022-06-26 MED ORDER — TIZANIDINE HCL 2 MG PO TABS: 2.0000 mg | ORAL_TABLET | Freq: Three times a day (TID) | ORAL | 0 refills | Status: DC | PRN

## 2022-06-26 MED ORDER — ONDANSETRON HCL 4 MG/2ML IJ SOLN
INTRAMUSCULAR | Status: AC
  Filled 2022-06-26: qty 2

## 2022-06-26 MED ORDER — ONDANSETRON HCL 4 MG/2ML IJ SOLN
INTRAMUSCULAR | Status: DC | PRN
  Administered 2022-06-26: 4 mg via INTRAVENOUS

## 2022-06-26 MED ORDER — CEFAZOLIN SODIUM-DEXTROSE 2-4 GM/100ML-% IV SOLN
INTRAVENOUS | Status: AC
  Filled 2022-06-26: qty 100

## 2022-06-26 MED ORDER — PROPOFOL 500 MG/50ML IV EMUL
INTRAVENOUS | Status: DC | PRN
  Administered 2022-06-26: 50 ug/kg/min via INTRAVENOUS

## 2022-06-26 MED ORDER — METHOCARBAMOL 500 MG IVPB - SIMPLE MED
500.0000 mg | Freq: Four times a day (QID) | INTRAVENOUS | Status: DC | PRN
  Administered 2022-06-26: 500 mg via INTRAVENOUS

## 2022-06-26 MED ORDER — ALBUMIN HUMAN 5 % IV SOLN
INTRAVENOUS | Status: AC
  Filled 2022-06-26: qty 250

## 2022-06-26 MED ORDER — BUPIVACAINE LIPOSOME 1.3 % IJ SUSP: 10.0000 mL | Freq: Once | INTRAMUSCULAR | Status: DC

## 2022-06-26 MED ORDER — DOCUSATE SODIUM 100 MG PO CAPS: 100.0000 mg | ORAL_CAPSULE | Freq: Two times a day (BID) | ORAL | 0 refills | Status: AC

## 2022-06-26 MED ORDER — LACTATED RINGERS IV SOLN: INTRAVENOUS | Status: DC

## 2022-06-26 MED ORDER — ALBUMIN HUMAN 5 % IV SOLN: INTRAVENOUS | Status: DC | PRN

## 2022-06-26 MED ORDER — OXYCODONE HCL 5 MG PO TABS
5.0000 mg | ORAL_TABLET | ORAL | Status: DC | PRN
  Administered 2022-06-26: 5 mg via ORAL

## 2022-06-26 MED ORDER — BUPIVACAINE-EPINEPHRINE 0.25% -1:200000 IJ SOLN
INTRAMUSCULAR | Status: DC | PRN
  Administered 2022-06-26: 30 mL

## 2022-06-26 MED ORDER — BUPIVACAINE LIPOSOME 1.3 % IJ SUSP
INTRAMUSCULAR | Status: DC | PRN
  Administered 2022-06-26: 20 mL

## 2022-06-26 MED ORDER — ONDANSETRON HCL 4 MG/2ML IJ SOLN: 4.0000 mg | Freq: Four times a day (QID) | INTRAMUSCULAR | Status: DC | PRN

## 2022-06-26 MED ORDER — CEFAZOLIN SODIUM-DEXTROSE 2-4 GM/100ML-% IV SOLN
2.0000 g | INTRAVENOUS | Status: AC
  Administered 2022-06-26: 2 g via INTRAVENOUS
  Filled 2022-06-26: qty 100

## 2022-06-26 MED ORDER — BUPIVACAINE IN DEXTROSE 0.75-8.25 % IT SOLN
INTRATHECAL | Status: DC | PRN
  Administered 2022-06-26: 1.8 mL via INTRATHECAL

## 2022-06-26 MED ORDER — CEFAZOLIN SODIUM-DEXTROSE 2-4 GM/100ML-% IV SOLN
2.0000 g | Freq: Four times a day (QID) | INTRAVENOUS | Status: AC
  Administered 2022-06-26: 2 g via INTRAVENOUS

## 2022-06-26 MED ORDER — WATER FOR IRRIGATION, STERILE IR SOLN
Status: DC | PRN
  Administered 2022-06-26: 2000 mL

## 2022-06-26 MED ORDER — PHENYLEPHRINE HCL-NACL 20-0.9 MG/250ML-% IV SOLN
INTRAVENOUS | Status: DC | PRN
  Administered 2022-06-26: 40 ug/min via INTRAVENOUS

## 2022-06-26 MED ORDER — TRANEXAMIC ACID-NACL 1000-0.7 MG/100ML-% IV SOLN
INTRAVENOUS | Status: AC
  Filled 2022-06-26: qty 100

## 2022-06-26 SURGICAL SUPPLY — 47 items
APL SKNCLS STERI-STRIP NONHPOA (GAUZE/BANDAGES/DRESSINGS) ×1
BAG COUNTER SPONGE SURGICOUNT (BAG) IMPLANT
BAG SPEC THK2 15X12 ZIP CLS (MISCELLANEOUS)
BAG SPNG CNTER NS LX DISP (BAG) ×1
BAG ZIPLOCK 12X15 (MISCELLANEOUS) IMPLANT
BENZOIN TINCTURE PRP APPL 2/3 (GAUZE/BANDAGES/DRESSINGS) IMPLANT
BLADE SAW SGTL 18X1.27X75 (BLADE) ×1 IMPLANT
BLADE SURG SZ10 CARB STEEL (BLADE) ×2 IMPLANT
CLSR STERI-STRIP ANTIMIC 1/2X4 (GAUZE/BANDAGES/DRESSINGS) IMPLANT
COVER PERINEAL POST (MISCELLANEOUS) ×1 IMPLANT
COVER SURGICAL LIGHT HANDLE (MISCELLANEOUS) ×1 IMPLANT
CUP ACETABULAR GRIPTON 100 52 (Orthopedic Implant) IMPLANT
DRAPE FOOT SWITCH (DRAPES) ×1 IMPLANT
DRAPE STERI IOBAN 125X83 (DRAPES) ×1 IMPLANT
DRAPE U-SHAPE 47X51 STRL (DRAPES) ×2 IMPLANT
DRSG AQUACEL AG ADV 3.5X 6 (GAUZE/BANDAGES/DRESSINGS) ×1 IMPLANT
DURAPREP 26ML APPLICATOR (WOUND CARE) ×1 IMPLANT
ELECT REM PT RETURN 15FT ADLT (MISCELLANEOUS) ×1 IMPLANT
ELIMINATOR HOLE APEX DEPUY (Hips) IMPLANT
GAUZE XEROFORM 1X8 LF (GAUZE/BANDAGES/DRESSINGS) IMPLANT
GLOVE BIOGEL PI IND STRL 8 (GLOVE) ×2 IMPLANT
GLOVE ECLIPSE 7.5 STRL STRAW (GLOVE) ×2 IMPLANT
GOWN STRL REUS W/ TWL XL LVL3 (GOWN DISPOSABLE) ×2 IMPLANT
GOWN STRL REUS W/TWL XL LVL3 (GOWN DISPOSABLE) ×2
GRIPTON 100 52 (Orthopedic Implant) ×1 IMPLANT
HEAD CERAMIC DELTA 36 PLUS 1.5 (Hips) IMPLANT
HOLDER FOLEY CATH W/STRAP (MISCELLANEOUS) ×1 IMPLANT
HOOD PEEL AWAY T7 (MISCELLANEOUS) ×3 IMPLANT
KIT TURNOVER KIT A (KITS) IMPLANT
LINER ACETAB NEUTRAL 36ID 520D (Liner) IMPLANT
NDL HYPO 22X1.5 SAFETY MO (MISCELLANEOUS) ×1 IMPLANT
NEEDLE HYPO 22X1.5 SAFETY MO (MISCELLANEOUS) ×1 IMPLANT
PACK ANTERIOR HIP CUSTOM (KITS) ×1 IMPLANT
SPIKE FLUID TRANSFER (MISCELLANEOUS) ×1 IMPLANT
STAPLER VISISTAT 35W (STAPLE) IMPLANT
STEM FEM ACTIS HIGH SZ7 (Stem) IMPLANT
STRIP CLOSURE SKIN 1/2X4 (GAUZE/BANDAGES/DRESSINGS) IMPLANT
SUT ETHIBOND NAB CT1 #1 30IN (SUTURE) ×2 IMPLANT
SUT MNCRL AB 3-0 PS2 18 (SUTURE) IMPLANT
SUT VIC AB 0 CT1 36 (SUTURE) ×1 IMPLANT
SUT VIC AB 1 CT1 36 (SUTURE) ×1 IMPLANT
SUT VIC AB 2-0 CT1 27 (SUTURE) ×1
SUT VIC AB 2-0 CT1 TAPERPNT 27 (SUTURE) ×1 IMPLANT
SUT VIC AB 3-0 CT1 27 (SUTURE)
SUT VIC AB 3-0 CT1 TAPERPNT 27 (SUTURE) IMPLANT
TRAY FOLEY MTR SLVR 16FR STAT (SET/KITS/TRAYS/PACK) ×1 IMPLANT
TUBE SUCTION HIGH CAP CLEAR NV (SUCTIONS) ×1 IMPLANT

## 2022-06-26 NOTE — Transfer of Care (Signed)
Immediate Anesthesia Transfer of Care Note  Patient: Randall Hanson  Procedure(s) Performed: TOTAL HIP ARTHROPLASTY ANTERIOR APPROACH (Left: Hip)  Patient Location: PACU  Anesthesia Type:Regional  Level of Consciousness: awake, alert , oriented, and patient cooperative  Airway & Oxygen Therapy: Patient Spontanous Breathing and Patient connected to face mask oxygen  Post-op Assessment: Report given to RN and Post -op Vital signs reviewed and stable  Post vital signs: Reviewed and stable  Last Vitals:  Vitals Value Taken Time  BP    Temp    Pulse    Resp    SpO2      Last Pain:  Vitals:   06/26/22 0619  TempSrc: Oral  PainSc:          Complications: No notable events documented.

## 2022-06-26 NOTE — Op Note (Addendum)
PATIENT ID:      Randall Hanson  MRN:     [id number] DOB/AGE:    [birth year] / 72 y.o.       OPERATIVE REPORT    DATE OF PROCEDURE:  06/26/2022       PREOPERATIVE DIAGNOSIS:  LEFT HIP OSTEOARTHRITIS                                                       Estimated body mass index is 30.71 kg/m as calculated from the following:   Height as of this encounter: 5\' 10"  (1.778 m).   Weight as of this encounter: 97.1 kg.     POSTOPERATIVE DIAGNOSIS:  LEFT HIP OSTEOARTHRITIS                                                           PROCEDURE:  1. left total hip arthroplasty using a 52 mm DePuy Pinnacle  Gription Cup, [employer],  neutral liner, a +1.5 36 mm ceramic head,  and a #7 Actis stem, 2.interpretation of multiple intraoperative fluoroscopic images   SURGEON: Harvie Junior    ASSISTANT:   Gus Puma PA-C  (present throughout entire procedure and necessary for timely completion of the procedure)  ANESTHESIA: spinal  BLOOD LOSS: 300 Tranexamic Acid: 1 gram IV DRAINS: None COMPLICATIONS: None    NDICATIONS FOR PROCEDURE:Patient with end-stage arthritis of the left hip.  X-rays show bone-on-bone arthritic changes. Despite conservative measures with observation, anti-inflammatory medicine, narcotics, use of a cane, has severe unremitting pain and can ambulate only less than 1 block before resting.  Patient desires elective left total hip arthroplasty to decrease pain and increase function. The risks, benefits, and alternatives were discussed at length including but not limited to the risks of infection, bleeding, nerve injury, stiffness, blood clots, the need for revision surgery, cardiopulmonary complications, among others, and they were willing to proceed.Benefits have been discussed. Questions answered.     PROCEDURE IN DETAIL: The patient was identified by armband,  received preoperative IV antibiotics in the holding area at [facility], taken to the operating  room , appropriate anesthetic monitors  were attached and spinal anesthesia was induced.  The patient was placed onto the hot bed and all bony prominences were well-padded.The left hip was prepped and draped for an anterior approach to the hip.  An incision was made and the subcutaneous dissection was down to the level of the tensor fascia.  The fascia was opened and finger dissected.  The bleeders coming across the anterior portion of the hip were identified and cauterized. Retractors were put in place above and below the femoral neck.  The capsule was opened and tagged and a provisional neck cut was made.  The head was removed and sized on the back table.  The acetabulum was sequentially reamed to a level of 51 mm and a 52 mm porous-coated pinnacle gription cup was hammered into place with 45 of lateral opening and 30 of anteversion.fluoroscopy was used to ensure this position of the cup.  Attention was turned towards the femur where the leg was actually rotated,  extended, and adduction did.  The femur was sequentially broached until a size of 7 broach gave a perfect fit and fill.at this point a  1.5 mm delta ceramic hip ball was placed and the hip reduced.  Fluoroscopic images were taken to assess the leg length, fit and fill of the stem, and cup position.  We were happy with the construct at this point.  The 7 broach was removed and a final Actis stem with standard offset  and a 1.5 mm ceramic hip ball was placed and reduced.  Final images were taken to make certain there were happy with the position at this point.   The capsule was closed with #1 Vicryl suture.  The tensor fascia was closed with 0 Vicryl suture.  The skin was then closed with combination of 0 and 2-0 Vicryl suture.  The top layer was with 3-0 Monocryl suture.  Benzoin and Steri-Strips were applied  and a sterile compressive dressing was applied and the patient taken to recovery room she noted be in satisfactory condition.  Blood loss  for the procedure was approximately 300 cc.  Of note Gus Puma was with the entire case and assisted by retraction of tissues, manipulation of the leg, and closing the minimize or time.   Harvie Junior 06/26/2022, 9:20 AM

## 2022-06-26 NOTE — Anesthesia Postprocedure Evaluation (Signed)
Anesthesia Post Note  Patient: Randall Hanson  Procedure(s) Performed: TOTAL HIP ARTHROPLASTY ANTERIOR APPROACH (Left: Hip)     Patient location during evaluation: PACU Anesthesia Type: Spinal Level of consciousness: oriented and awake and alert Pain management: pain level controlled Vital Signs Assessment: post-procedure vital signs reviewed and stable Respiratory status: spontaneous breathing, respiratory function stable and patient connected to nasal cannula oxygen Cardiovascular status: blood pressure returned to baseline and stable Postop Assessment: no headache, no backache and no apparent nausea or vomiting Anesthetic complications: no  No notable events documented.  Last Vitals:  Vitals:   06/26/22 1151 06/26/22 1200  BP:  131/71  Pulse: 83 75  Resp:  16  Temp:    SpO2: 95% 97%    Last Pain:  Vitals:   06/26/22 1151  TempSrc:   PainSc: 3                  Saida Lonon L Rashanda Magloire

## 2022-06-26 NOTE — H&P (Signed)
TOTAL HIP ADMISSION H&P  Patient is admitted for left total hip arthroplasty.  Subjective:  Chief Complaint: left hip pain  HPI: Randall Hanson, 72 y.o. male, has a history of pain and functional disability in the left hip(s) due to arthritis and patient has failed non-surgical conservative treatments for greater than 12 weeks to include NSAID's and/or analgesics, viscosupplementation injections, flexibility and strengthening excercises, supervised PT with diminished ADL's post treatment, and activity modification.  Onset of symptoms was gradual starting 3 years ago with gradually worsening course since that time.The patient noted no past surgery on the left hip(s).  Patient currently rates pain in the left hip at 9 out of 10 with activity. Patient has night pain, worsening of pain with activity and weight bearing, trendelenberg gait, pain that interfers with activities of daily living, pain with passive range of motion, and crepitus. Patient has evidence of subchondral cysts, subchondral sclerosis, and joint space narrowing by imaging studies. This condition presents safety issues increasing the risk of falls. This patient has had  failure of all reasonable conservative care .  There is no current active infection.  Patient Active Problem List   Diagnosis Date Noted   Weight gain 02/24/2022   Hypertension goal BP (blood pressure) < 130/80 10/11/2021   History of suicide attempt 09/27/2021   MDD (major depressive disorder), recurrent episode, moderate (HCC) 09/05/2021   Suicide attempt by multiple drug overdose (HCC) 09/04/2021   AKI (acute kidney injury) (HCC) 09/04/2021   Aspiration pneumonitis (HCC) 09/04/2021   Depressed mood 02/23/2021   Chronic pain syndrome 02/23/2021   Vitamin D insufficiency 12/22/2020   Aortic atherosclerosis (HCC) 09/21/2020   OSA (obstructive sleep apnea) 09/16/2020   Sensorineural hearing loss (SNHL), bilateral 09/16/2020   Insomnia 09/16/2020   Primary  osteoarthritis of left hip 09/16/2020   Lumbar spondylosis 09/16/2020   Anxiety disorder, unspecified 09/16/2020   Benign essential hypertension 08/02/2020   Prediabetes 11/17/2019   Combined forms of age-related cataract of left eye 07/24/2019   Personal history of pulmonary embolism 05/22/2018   Prostate cancer (HCC) 05/30/2016   Vitamin B 12 deficiency 10/23/2015   Diverticulosis of large intestine without hemorrhage 06/21/2015   Tubular adenoma of colon 06/21/2015   History of total knee arthroplasty, right 06/01/2014   Dyslipidemia 02/15/2014   ED (erectile dysfunction) 12/29/2011   Past Medical History:  Diagnosis Date   Anxiety    Arthritis    Colon polyps    High cholesterol    History of kidney stones    HOH (hard of hearing)    has Hearing Aids   Hypertension    Joint pain    Pre-diabetes    Prostate cancer (HCC)    Sleep apnea     Past Surgical History:  Procedure Laterality Date   LASIK Bilateral    PROSTATECTOMY     REPLACEMENT TOTAL KNEE Right 2015   done at [facility] in [place]   SHOULDER SURGERY Right    x3  arthroscopies  rotator cuff and labral repair   WISDOM TOOTH EXTRACTION      Current Facility-Administered Medications  Medication Dose Route Frequency Provider Last Rate Last Admin   bupivacaine liposome (EXPAREL) 1.3 % injection 133 mg  10 mL Other Once Jodi Geralds, MD       ceFAZolin (ANCEF) IVPB 2g/100 mL premix  2 g Intravenous On Call to OR Jodi Geralds, MD       lactated ringers infusion   Intravenous Continuous Richardson Landry Tera Mater, MD  10 mL/hr at 06/26/22 0628 New Bag at 06/26/22 0628   povidone-iodine 10 % swab 2 Application  2 Application Topical Once Jodi Geralds, MD       tranexamic acid (CYKLOKAPRON) IVPB 1,000 mg  1,000 mg Intravenous To OR Jodi Geralds, MD       Allergies  Allergen Reactions   Mirtazapine     "Made me feel awful"   Trazodone And Nefazodone     Vision blurry/eyes water    Social History   Tobacco Use    Smoking status: Never   Smokeless tobacco: Never  Substance Use Topics   Alcohol use: Yes    Comment: 1 standard drink 2x monthly    Family History  Problem Relation Age of Onset   Cancer Father      Review of Systems ROS: I have reviewed the patient's review of systems thoroughly and there are no positive responses as relates to the HPI.  Objective:  Physical Exam  Vital signs in last 24 hours: Temp:  [98.4 F (36.9 C)] 98.4 F (36.9 C) (06/10 0619) Pulse Rate:  [73] 73 (06/10 0619) Resp:  [18] 18 (06/10 0619) BP: (138)/(84) 138/84 (06/10 0619) SpO2:  [98 %] 98 % (06/10 0619) Weight:  [97.1 kg] 97.1 kg (06/10 0535) Well-developed well-nourished patient in no acute distress. Alert and oriented x3 HEENT:within normal limits Cardiac: Regular rate and rhythm Pulmonary: Lungs clear to auscultation Abdomen: Soft and nontender.  Normal active bowel sounds  Musculoskeletal: (l hip: painful rom limited rom no instability)  Labs:   Estimated body mass index is 30.71 kg/m as calculated from the following:   Height as of this encounter: 5\' 10"  (1.778 m).   Weight as of this encounter: 97.1 kg.   Imaging Review Plain radiographs demonstrate severe degenerative joint disease of the left hip(s). The bone quality appears to be fair for age and reported activity level.      Assessment/Plan:  End stage arthritis, left hip(s)  The patient history, physical examination, clinical judgement of the provider and imaging studies are consistent with end stage degenerative joint disease of the left hip(s) and total hip arthroplasty is deemed medically necessary. The treatment options including medical management, injection therapy, arthroscopy and arthroplasty were discussed at length. The risks and benefits of total hip arthroplasty were presented and reviewed. The risks due to aseptic loosening, infection, stiffness, dislocation/subluxation,  thromboembolic complications and other  imponderables were discussed.  The patient acknowledged the explanation, agreed to proceed with the plan and consent was signed. Patient is being admitted for inpatient treatment for surgery, pain control, PT, OT, prophylactic antibiotics, VTE prophylaxis, progressive ambulation and ADL's and discharge planning.The patient is planning to be discharged home with home health services

## 2022-06-26 NOTE — Evaluation (Signed)
Physical Therapy Evaluation Patient Details Name: Randall Hanson MRN: [id number] DOB: [birth year] Today's Date: 06/26/2022  History of Present Illness  72 yo male presents to therapy s/p L THA, anterior approach on 06/26/2022 due to failure of conservative measures. Pt PMH includes but is not limited to: HTN, AKI, depression, chronic pain, HOH, lumbar spondylosis, PE, diverticulosis, R TKA (2015), prostate Ca, OSA, and R shoulder sx.  Clinical Impression   Randall Hanson is a 72 y.o. male POD 0 s/p L THA. Patient reports IND with mobility at baseline. Patient is now limited by functional impairments (see PT problem list below) and requires min guard for transfers and gait with RW. Patient was able to ambulate 50 x 3 feet with RW and min guard and progressing to close S  and cues for safe walker management. Patient educated on safe sequencing for stair mobility, car transfers, fall risk prevention, pain management and use of RW pt and spouse verbalized understanding of safe guarding position for people assisting with mobility. Patient instructed in exercises to facilitate ROM and circulation reviewed and HO provided.  Patient will benefit from continued skilled PT interventions to address impairments and progress towards PLOF. Patient has met mobility goals at adequate level for discharge home with family support and OPPT starting 6/12; will continue to follow if pt continues acute stay to progress towards Mod I goals.       Recommendations for follow up therapy are one component of a multi-disciplinary discharge planning process, led by the attending physician.  Recommendations may be updated based on patient status, additional functional criteria and insurance authorization.  Follow Up Recommendations       Assistance Recommended at Discharge Intermittent Supervision/Assistance  Patient can return home with the following  A little help with walking and/or transfers;A little help with  bathing/dressing/bathroom;Assistance with cooking/housework;Assist for transportation;Help with stairs or ramp for entrance    Equipment Recommendations Rolling walker (2 wheels) (provided and adjusted at eval)  Recommendations for Other Services       Functional Status Assessment Patient has had a recent decline in their functional status and demonstrates the ability to make significant improvements in function in a reasonable and predictable amount of time.     Precautions / Restrictions Precautions Precautions: Fall Restrictions Weight Bearing Restrictions: No      Mobility  Bed Mobility Overal bed mobility: Needs Assistance Bed Mobility: Supine to Sit     Supine to sit: Min guard     General bed mobility comments: cues and increased time    Transfers Overall transfer level: Needs assistance Equipment used: Rolling walker (2 wheels) Transfers: Sit to/from Stand Sit to Stand: Min guard           General transfer comment: min cues for proper UE placement    Ambulation/Gait Ambulation/Gait assistance: Min guard Gait Distance (Feet): 50 Feet Assistive device: Rolling walker (2 wheels) Gait Pattern/deviations: Step-to pattern, Antalgic Gait velocity: decreased     General Gait Details: cues for continuous advacement of RW  Stairs Stairs: Yes Stairs assistance: Min guard Stair Management: One rail Right Number of Stairs: 2 General stair comments: B UE support on R handrail with cues for proper sequencing  Wheelchair Mobility    Modified Rankin (Stroke Patients Only)       Balance Overall balance assessment: Needs assistance Sitting-balance support: Feet supported Sitting balance-Leahy Scale: Good     Standing balance support: Bilateral upper extremity supported, During functional activity, Reliant on assistive device for balance  Standing balance-Leahy Scale: Fair Standing balance comment: no UE support with static standng                              Pertinent Vitals/Pain Pain Assessment Pain Assessment: 0-10 Pain Score: 4  Pain Location: L hip, grion and leg Pain Descriptors / Indicators: Aching, Dull, Operative site guarding Pain Intervention(s): Limited activity within patient's tolerance, Monitored during session, Premedicated before session, Repositioned, Ice applied    Home Living Family/patient expects to be discharged to:: Private residence Living Arrangements: Spouse/significant other Available Help at Discharge: Family Type of Home: House Home Access: Stairs to enter Entrance Stairs-Rails: Right Entrance Stairs-Number of Steps: 2   Home Layout: One level Home Equipment: Cane - single point;Rollator (4 wheels)      Prior Function Prior Level of Function : Independent/Modified Independent             Mobility Comments: intermittent use of SPC for mobility, mod I for all ADLs, self care tasks, IADLS       Hand Dominance        Extremity/Trunk Assessment        Lower Extremity Assessment Lower Extremity Assessment: LLE deficits/detail LLE Deficits / Details: ankle DF/PF 5/5 LLE Sensation: WNL    Cervical / Trunk Assessment Cervical / Trunk Assessment:  (wfl)  Communication   Communication: No difficulties  Cognition Arousal/Alertness: Awake/alert Behavior During Therapy: WFL for tasks assessed/performed Overall Cognitive Status: Within Functional Limits for tasks assessed                                          General Comments      Exercises Total Joint Exercises Ankle Circles/Pumps: AROM, Both, 20 reps Quad Sets: AROM, Left, 10 reps, 5 reps Short Arc Quad: AROM, Left, 5 reps Heel Slides: AROM, Left, 5 reps Hip ABduction/ADduction: AROM, Left, 5 reps, Supine, Standing Long Arc Quad: AROM, Left, 5 reps Knee Flexion: AROM, Left, 5 reps, Standing Standing Hip Extension: AROM, 5 reps, Left   Assessment/Plan    PT Assessment Patient needs continued PT  services  PT Problem List Decreased strength;Decreased range of motion;Decreased activity tolerance;Decreased balance;Decreased mobility;Decreased coordination;Pain       PT Treatment Interventions DME instruction;Gait training;Stair training;Functional mobility training;Therapeutic activities;Therapeutic exercise;Balance training;Neuromuscular re-education;Patient/family education;Modalities    PT Goals (Current goals can be found in the Care Plan section)  Acute Rehab PT Goals Patient Stated Goal: to be able to walk prolonged distances without pain and return to golfing PT Goal Formulation: With patient Time For Goal Achievement: 07/11/22 Potential to Achieve Goals: Good    Frequency 7X/week     Co-evaluation               AM-PAC PT "6 Clicks" Mobility  Outcome Measure Help needed turning from your back to your side while in a flat bed without using bedrails?: A Little Help needed moving from lying on your back to sitting on the side of a flat bed without using bedrails?: A Little Help needed moving to and from a bed to a chair (including a wheelchair)?: A Little Help needed standing up from a chair using your arms (e.g., wheelchair or bedside chair)?: A Little Help needed to walk in hospital room?: A Little Help needed climbing 3-5 steps with a railing? : A Little 6 Click Score: 18  End of Session Equipment Utilized During Treatment: Gait belt Activity Tolerance: Patient tolerated treatment well Patient left: in chair;with family/visitor present;with nursing/sitter in room;with call bell/phone within reach Nurse Communication: Mobility status;Other (comment) (readiness for d/c from PT standpoint) PT Visit Diagnosis: Unsteadiness on feet (R26.81);Other abnormalities of gait and mobility (R26.89);Muscle weakness (generalized) (M62.81);Pain Pain - Right/Left: Left Pain - part of body: Knee;Leg    Time: 8413-2440 PT Time Calculation (min) (ACUTE ONLY): 48  min   Charges:   PT Evaluation $PT Eval Low Complexity: 1 Low PT Treatments $Gait Training: 8-22 mins $Therapeutic Exercise: 8-22 mins        Johnny Bridge, PT Acute Rehab   Jacqualyn Posey 06/26/2022, 3:34 PM

## 2022-06-26 NOTE — Interval H&P Note (Signed)
History and Physical Interval Note:  06/26/2022 7:27 AM  Randall Hanson  has presented today for surgery, with the diagnosis of LEFT HIP OSTEOARTHRITIS.  The various methods of treatment have been discussed with the patient and family. After consideration of risks, benefits and other options for treatment, the patient has consented to  Procedure(s): TOTAL HIP ARTHROPLASTY ANTERIOR APPROACH (Left) as a surgical intervention.  The patient's history has been reviewed, patient examined, no change in status, stable for surgery.  I have reviewed the patient's chart and labs.  Questions were answered to the patient's satisfaction.     Harvie Junior

## 2022-06-26 NOTE — Discharge Instructions (Signed)

## 2022-06-26 NOTE — Anesthesia Procedure Notes (Signed)
Spinal  Patient location during procedure: OR Start time: 06/26/2022 7:38 AM End time: 06/26/2022 7:42 AM Reason for block: surgical anesthesia Staffing Resident/CRNA: Garth Bigness, CRNA Performed by: Garth Bigness, CRNA Authorized by: [name] Picker, MD   Preanesthetic Checklist Completed: patient identified, IV checked, site marked, risks and benefits discussed, surgical consent, monitors and equipment checked, pre-op evaluation and timeout performed Spinal Block Patient position: sitting Prep: ChloraPrep Patient monitoring: heart rate, continuous pulse ox and blood pressure Approach: midline Location: L4-5 Injection technique: single-shot Needle Needle type: Pencan  Needle gauge: 24 G Needle length: 9 cm Needle insertion depth: 5 cm Assessment Sensory level: T10 Events: CSF return Additional Notes

## 2022-06-27 ENCOUNTER — Encounter (HOSPITAL_COMMUNITY): Payer: Self-pay | Admitting: Orthopedic Surgery

## 2022-06-28 DIAGNOSIS — R531 Weakness: Secondary | ICD-10-CM | POA: Diagnosis not present

## 2022-06-28 DIAGNOSIS — M25552 Pain in left hip: Secondary | ICD-10-CM | POA: Diagnosis not present

## 2022-06-28 DIAGNOSIS — R269 Unspecified abnormalities of gait and mobility: Secondary | ICD-10-CM | POA: Diagnosis not present

## 2022-06-29 ENCOUNTER — Encounter: Payer: Self-pay | Admitting: Physician Assistant

## 2022-06-29 DIAGNOSIS — M25552 Pain in left hip: Secondary | ICD-10-CM | POA: Diagnosis not present

## 2022-06-29 DIAGNOSIS — R269 Unspecified abnormalities of gait and mobility: Secondary | ICD-10-CM | POA: Diagnosis not present

## 2022-06-29 DIAGNOSIS — R531 Weakness: Secondary | ICD-10-CM | POA: Diagnosis not present

## 2022-06-30 ENCOUNTER — Encounter: Payer: Self-pay | Admitting: Sports Medicine

## 2022-06-30 DIAGNOSIS — R9389 Abnormal findings on diagnostic imaging of other specified body structures: Secondary | ICD-10-CM

## 2022-06-30 DIAGNOSIS — R911 Solitary pulmonary nodule: Secondary | ICD-10-CM

## 2022-07-03 DIAGNOSIS — R531 Weakness: Secondary | ICD-10-CM | POA: Diagnosis not present

## 2022-07-03 DIAGNOSIS — R269 Unspecified abnormalities of gait and mobility: Secondary | ICD-10-CM | POA: Diagnosis not present

## 2022-07-03 DIAGNOSIS — M25552 Pain in left hip: Secondary | ICD-10-CM | POA: Diagnosis not present

## 2022-07-05 ENCOUNTER — Encounter: Payer: Self-pay | Admitting: Physician Assistant

## 2022-07-05 DIAGNOSIS — M25552 Pain in left hip: Secondary | ICD-10-CM | POA: Diagnosis not present

## 2022-07-05 DIAGNOSIS — R531 Weakness: Secondary | ICD-10-CM | POA: Diagnosis not present

## 2022-07-05 DIAGNOSIS — R269 Unspecified abnormalities of gait and mobility: Secondary | ICD-10-CM | POA: Diagnosis not present

## 2022-07-06 ENCOUNTER — Ambulatory Visit (INDEPENDENT_AMBULATORY_CARE_PROVIDER_SITE_OTHER): Payer: Medicare Other

## 2022-07-06 DIAGNOSIS — R911 Solitary pulmonary nodule: Secondary | ICD-10-CM

## 2022-07-06 DIAGNOSIS — R918 Other nonspecific abnormal finding of lung field: Secondary | ICD-10-CM | POA: Diagnosis not present

## 2022-07-07 DIAGNOSIS — G4733 Obstructive sleep apnea (adult) (pediatric): Secondary | ICD-10-CM | POA: Diagnosis not present

## 2022-07-10 DIAGNOSIS — R269 Unspecified abnormalities of gait and mobility: Secondary | ICD-10-CM | POA: Diagnosis not present

## 2022-07-10 DIAGNOSIS — M25552 Pain in left hip: Secondary | ICD-10-CM | POA: Diagnosis not present

## 2022-07-10 DIAGNOSIS — R531 Weakness: Secondary | ICD-10-CM | POA: Diagnosis not present

## 2022-07-11 ENCOUNTER — Encounter: Payer: Self-pay | Admitting: Physician Assistant

## 2022-07-11 DIAGNOSIS — I2510 Atherosclerotic heart disease of native coronary artery without angina pectoris: Secondary | ICD-10-CM | POA: Insufficient documentation

## 2022-07-11 DIAGNOSIS — M1612 Unilateral primary osteoarthritis, left hip: Secondary | ICD-10-CM | POA: Diagnosis not present

## 2022-07-11 NOTE — Progress Notes (Signed)
CT results show no acute process in lungs. You do have tiny upper lobe granulomas(scar tissue) and 1-22mm pulmonary nodules which do not meet follow up criteria. You do have known coronary plaque. Make sure to stay on cholesterol medication and ASA 81mg  daily. Are you having any chest tightness or pain with exertion?

## 2022-07-12 DIAGNOSIS — R269 Unspecified abnormalities of gait and mobility: Secondary | ICD-10-CM | POA: Diagnosis not present

## 2022-07-12 DIAGNOSIS — R531 Weakness: Secondary | ICD-10-CM | POA: Diagnosis not present

## 2022-07-12 DIAGNOSIS — M25552 Pain in left hip: Secondary | ICD-10-CM | POA: Diagnosis not present

## 2022-07-17 DIAGNOSIS — R531 Weakness: Secondary | ICD-10-CM | POA: Diagnosis not present

## 2022-07-17 DIAGNOSIS — R269 Unspecified abnormalities of gait and mobility: Secondary | ICD-10-CM | POA: Diagnosis not present

## 2022-07-17 DIAGNOSIS — M25552 Pain in left hip: Secondary | ICD-10-CM | POA: Diagnosis not present

## 2022-07-18 DIAGNOSIS — G4733 Obstructive sleep apnea (adult) (pediatric): Secondary | ICD-10-CM | POA: Diagnosis not present

## 2022-07-19 DIAGNOSIS — R269 Unspecified abnormalities of gait and mobility: Secondary | ICD-10-CM | POA: Diagnosis not present

## 2022-07-19 DIAGNOSIS — M25552 Pain in left hip: Secondary | ICD-10-CM | POA: Diagnosis not present

## 2022-07-19 DIAGNOSIS — R531 Weakness: Secondary | ICD-10-CM | POA: Diagnosis not present

## 2022-07-24 DIAGNOSIS — R269 Unspecified abnormalities of gait and mobility: Secondary | ICD-10-CM | POA: Diagnosis not present

## 2022-07-24 DIAGNOSIS — R531 Weakness: Secondary | ICD-10-CM | POA: Diagnosis not present

## 2022-07-24 DIAGNOSIS — M25552 Pain in left hip: Secondary | ICD-10-CM | POA: Diagnosis not present

## 2022-07-26 DIAGNOSIS — R531 Weakness: Secondary | ICD-10-CM | POA: Diagnosis not present

## 2022-07-26 DIAGNOSIS — M25552 Pain in left hip: Secondary | ICD-10-CM | POA: Diagnosis not present

## 2022-07-26 DIAGNOSIS — R269 Unspecified abnormalities of gait and mobility: Secondary | ICD-10-CM | POA: Diagnosis not present

## 2022-07-31 DIAGNOSIS — R269 Unspecified abnormalities of gait and mobility: Secondary | ICD-10-CM | POA: Diagnosis not present

## 2022-07-31 DIAGNOSIS — R531 Weakness: Secondary | ICD-10-CM | POA: Diagnosis not present

## 2022-07-31 DIAGNOSIS — M25552 Pain in left hip: Secondary | ICD-10-CM | POA: Diagnosis not present

## 2022-08-02 DIAGNOSIS — R531 Weakness: Secondary | ICD-10-CM | POA: Diagnosis not present

## 2022-08-02 DIAGNOSIS — M25552 Pain in left hip: Secondary | ICD-10-CM | POA: Diagnosis not present

## 2022-08-02 DIAGNOSIS — R269 Unspecified abnormalities of gait and mobility: Secondary | ICD-10-CM | POA: Diagnosis not present

## 2022-08-06 DIAGNOSIS — G4733 Obstructive sleep apnea (adult) (pediatric): Secondary | ICD-10-CM | POA: Diagnosis not present

## 2022-08-07 ENCOUNTER — Encounter: Payer: Self-pay | Admitting: Physician Assistant

## 2022-08-07 DIAGNOSIS — H903 Sensorineural hearing loss, bilateral: Secondary | ICD-10-CM

## 2022-08-07 NOTE — Telephone Encounter (Signed)
Ok for referral for bilateral sensorineural hearing loss.

## 2022-08-25 ENCOUNTER — Ambulatory Visit: Payer: Medicare Other | Admitting: Physician Assistant

## 2022-08-28 ENCOUNTER — Ambulatory Visit (INDEPENDENT_AMBULATORY_CARE_PROVIDER_SITE_OTHER): Payer: Medicare Other | Admitting: Physician Assistant

## 2022-08-28 ENCOUNTER — Encounter: Payer: Self-pay | Admitting: Physician Assistant

## 2022-08-28 VITALS — BP 119/62 | HR 83 | Ht 70.0 in | Wt 221.0 lb

## 2022-08-28 DIAGNOSIS — R7303 Prediabetes: Secondary | ICD-10-CM | POA: Diagnosis not present

## 2022-08-28 DIAGNOSIS — E785 Hyperlipidemia, unspecified: Secondary | ICD-10-CM | POA: Diagnosis not present

## 2022-08-28 DIAGNOSIS — Z96642 Presence of left artificial hip joint: Secondary | ICD-10-CM

## 2022-08-28 DIAGNOSIS — Z96641 Presence of right artificial hip joint: Secondary | ICD-10-CM | POA: Insufficient documentation

## 2022-08-28 DIAGNOSIS — I2510 Atherosclerotic heart disease of native coronary artery without angina pectoris: Secondary | ICD-10-CM | POA: Diagnosis not present

## 2022-08-28 DIAGNOSIS — I700 Atherosclerosis of aorta: Secondary | ICD-10-CM

## 2022-08-28 DIAGNOSIS — M25472 Effusion, left ankle: Secondary | ICD-10-CM

## 2022-08-28 MED ORDER — ATORVASTATIN CALCIUM 40 MG PO TABS
40.0000 mg | ORAL_TABLET | Freq: Every day | ORAL | 1 refills | Status: DC
Start: 2022-08-28 — End: 2023-02-15

## 2022-08-28 NOTE — Progress Notes (Signed)
Established Patient Office Visit  Subjective   Patient ID: Randall Hanson, male    DOB: 01-27-1950  Age: 72 y.o. MRN: [id number]  Chief Complaint  Patient presents with   Medical Management of Chronic Issues    6 mo fup, left leg swelling after hip surgery     HPI Pt is a 72 yo male with HTN, OSA, right DDD and total left hip replacement on 06/26/2022. He is doing well but needed to follow up and get medication refilled. His pain has decreased and he is off pain medication and muscle relaxers.   He is having some intermittent swelling of left ankle that comes and goes and not painful. Compression and elevation help. Denies any SOB, CP, palpitations.   He did have chest CT that he would like to go over results.   .. Active Ambulatory Problems    Diagnosis Date Noted   Benign essential hypertension 08/02/2020   Diverticulosis of large intestine without hemorrhage 06/21/2015   Dyslipidemia 02/15/2014   ED (erectile dysfunction) 12/29/2011   History of total knee arthroplasty, right 06/01/2014   OSA (obstructive sleep apnea) 09/16/2020   Personal history of pulmonary embolism 05/22/2018   Prediabetes 11/17/2019   Prostate cancer (HCC) 05/30/2016   Sensorineural hearing loss (SNHL), bilateral 09/16/2020   Tubular adenoma of colon 06/21/2015   Vitamin B 12 deficiency 10/23/2015   Combined forms of age-related cataract of left eye 07/24/2019   Insomnia 09/16/2020   Primary osteoarthritis of left hip 09/16/2020   Lumbar spondylosis 09/16/2020   Anxiety disorder, unspecified 09/16/2020   Aortic atherosclerosis (HCC) 09/21/2020   Vitamin D insufficiency 12/22/2020   Depressed mood 02/23/2021   Chronic pain syndrome 02/23/2021   Suicide attempt by multiple drug overdose (HCC) 09/04/2021   AKI (acute kidney injury) (HCC) 09/04/2021   Aspiration pneumonitis (HCC) 09/04/2021   MDD (major depressive disorder), recurrent episode, moderate (HCC) 09/05/2021   History of suicide  attempt 09/27/2021   Hypertension goal BP (blood pressure) < 130/80 10/11/2021   Weight gain 02/24/2022   CAD in native artery 07/11/2022   Left ankle swelling 08/28/2022   Status post right hip replacement 08/28/2022   Resolved Ambulatory Problems    Diagnosis Date Noted   DDD (degenerative disc disease), lumbar 09/21/2020   Left hip pain 02/23/2021   Past Medical History:  Diagnosis Date   Anxiety    Arthritis    Colon polyps    High cholesterol    History of kidney stones    HOH (hard of hearing)    Hypertension    Joint pain    Pre-diabetes    Sleep apnea      ROS   See HPI.  Objective:     BP 119/62   Pulse 83   Ht 5\' 10"  (1.778 m)   Wt 221 lb (100.2 kg)   SpO2 99%   BMI 31.71 kg/m  BP Readings from Last 3 Encounters:  08/28/22 119/62  06/26/22 (!) 145/72  06/20/22 107/66   Wt Readings from Last 3 Encounters:  08/28/22 221 lb (100.2 kg)  06/26/22 214 lb (97.1 kg)  06/20/22 214 lb (97.1 kg)      .Marland Kitchen Lab Results  Component Value Date   HGBA1C 6.2 (H) 06/20/2022     Physical Exam Constitutional:      Appearance: Normal appearance.  HENT:     Head: Normocephalic.  Cardiovascular:     Rate and Rhythm: Normal rate and regular rhythm.  Pulses: Normal pulses.  Pulmonary:     Effort: Pulmonary effort is normal.     Breath sounds: Normal breath sounds.  Musculoskeletal:     Right lower leg: No edema.     Left lower leg: Edema present.     Comments: Left ankle scant non-pitting edema with varicose veins and superficial veins present No warmth or tenderness No redness NROM of left ankle Negative homans sign  Neurological:     Mental Status: He is alert.       The 10-year ASCVD risk score (Arnett DK, et al., 2019) is: 20.2%    Assessment & Plan:  .Marland KitchenMarland KitchenJahzir was seen today for medical management of chronic issues.  Diagnoses and all orders for this visit:  CAD in native artery -     atorvastatin (LIPITOR) 40 MG tablet; Take 1 tablet  (40 mg total) by mouth daily.  Aortic atherosclerosis (HCC) -     atorvastatin (LIPITOR) 40 MG tablet; Take 1 tablet (40 mg total) by mouth daily.  Left ankle swelling  Status post left hip replacement  Prediabetes -     atorvastatin (LIPITOR) 40 MG tablet; Take 1 tablet (40 mg total) by mouth daily.   Doing great after hip replacement with some intermittent swelling of left ankle that appears to be more venous stasis No warmth or tenderness over left ankle to suggest blood clot If continues will get ultrasound to rule out blood clot On ASA Use compression stockings and increase to 25mg  of hydrochlorothiazide if needed for swelling on worse days CT showed aortic atherosclerosis and coronary atherosclerosis Stop simvastatin and start lipitor 40mg  daily Goal LDL under 70 at 93 currently Will get stress test for further work up Declined cardiology referral right now Discussed pre-diabetes and prevention with diet and exercise Follow up in 3 months   Return in about 3 months (around 11/28/2022).    Tandy Gaw, PA-C

## 2022-08-28 NOTE — Patient Instructions (Addendum)
Will order stress test

## 2022-08-29 ENCOUNTER — Encounter (HOSPITAL_COMMUNITY): Payer: Self-pay | Admitting: Physician Assistant

## 2022-08-30 ENCOUNTER — Encounter (HOSPITAL_COMMUNITY): Payer: Self-pay

## 2022-08-30 ENCOUNTER — Telehealth (HOSPITAL_COMMUNITY): Payer: Self-pay | Admitting: *Deleted

## 2022-08-30 NOTE — Telephone Encounter (Signed)
Pt reached and given instructions for MPI study and instructions also sent via mychart.

## 2022-08-31 ENCOUNTER — Ambulatory Visit (HOSPITAL_COMMUNITY): Payer: Medicare Other | Attending: Physician Assistant

## 2022-08-31 DIAGNOSIS — R7303 Prediabetes: Secondary | ICD-10-CM | POA: Insufficient documentation

## 2022-08-31 DIAGNOSIS — I2510 Atherosclerotic heart disease of native coronary artery without angina pectoris: Secondary | ICD-10-CM | POA: Insufficient documentation

## 2022-08-31 DIAGNOSIS — I700 Atherosclerosis of aorta: Secondary | ICD-10-CM | POA: Diagnosis not present

## 2022-08-31 DIAGNOSIS — E785 Hyperlipidemia, unspecified: Secondary | ICD-10-CM | POA: Diagnosis not present

## 2022-08-31 LAB — MYOCARDIAL PERFUSION IMAGING
LV dias vol: 81 mL (ref 62–150)
LV sys vol: 29 mL
Nuc Stress EF: 64 %
Peak HR: 80 {beats}/min
Rest HR: 65 {beats}/min
Rest Nuclear Isotope Dose: 10.1 mCi
SDS: 0
SRS: 0
SSS: 0
ST Depression (mm): 0 mm
Stress Nuclear Isotope Dose: 31.7 mCi
TID: 1.01

## 2022-08-31 MED ORDER — TECHNETIUM TC 99M TETROFOSMIN IV KIT
10.1000 | PACK | Freq: Once | INTRAVENOUS | Status: AC | PRN
  Administered 2022-08-31: 10.1 via INTRAVENOUS

## 2022-08-31 MED ORDER — REGADENOSON 0.4 MG/5ML IV SOLN
0.4000 mg | Freq: Once | INTRAVENOUS | Status: AC
Start: 2022-08-31 — End: 2022-08-31
  Administered 2022-08-31: 0.4 mg via INTRAVENOUS

## 2022-08-31 MED ORDER — TECHNETIUM TC 99M TETROFOSMIN IV KIT
31.7000 | PACK | Freq: Once | INTRAVENOUS | Status: AC | PRN
  Administered 2022-08-31: 31.7 via INTRAVENOUS

## 2022-09-01 NOTE — Progress Notes (Signed)
GREAT news. Normal stress test!

## 2022-09-06 DIAGNOSIS — G4733 Obstructive sleep apnea (adult) (pediatric): Secondary | ICD-10-CM | POA: Diagnosis not present

## 2022-09-19 DIAGNOSIS — Z9889 Other specified postprocedural states: Secondary | ICD-10-CM | POA: Diagnosis not present

## 2022-09-21 DIAGNOSIS — Z01118 Encounter for examination of ears and hearing with other abnormal findings: Secondary | ICD-10-CM | POA: Diagnosis not present

## 2022-09-21 DIAGNOSIS — Z461 Encounter for fitting and adjustment of hearing aid: Secondary | ICD-10-CM | POA: Diagnosis not present

## 2022-09-21 DIAGNOSIS — H903 Sensorineural hearing loss, bilateral: Secondary | ICD-10-CM | POA: Diagnosis not present

## 2022-09-22 ENCOUNTER — Other Ambulatory Visit: Payer: Self-pay | Admitting: Physician Assistant

## 2022-09-22 DIAGNOSIS — F411 Generalized anxiety disorder: Secondary | ICD-10-CM

## 2022-09-28 DIAGNOSIS — D3132 Benign neoplasm of left choroid: Secondary | ICD-10-CM | POA: Diagnosis not present

## 2022-09-28 DIAGNOSIS — H43813 Vitreous degeneration, bilateral: Secondary | ICD-10-CM | POA: Diagnosis not present

## 2022-09-28 DIAGNOSIS — D3131 Benign neoplasm of right choroid: Secondary | ICD-10-CM | POA: Diagnosis not present

## 2022-09-28 DIAGNOSIS — Z961 Presence of intraocular lens: Secondary | ICD-10-CM | POA: Diagnosis not present

## 2022-09-29 ENCOUNTER — Other Ambulatory Visit: Payer: Self-pay | Admitting: Physician Assistant

## 2022-10-04 ENCOUNTER — Encounter: Payer: Self-pay | Admitting: Physician Assistant

## 2022-10-05 MED ORDER — DULOXETINE HCL 30 MG PO CPEP: 30.0000 mg | ORAL_CAPSULE | Freq: Every day | ORAL | 3 refills | Status: DC

## 2022-11-02 ENCOUNTER — Other Ambulatory Visit: Payer: Self-pay | Admitting: Physician Assistant

## 2022-11-13 ENCOUNTER — Encounter: Payer: Self-pay | Admitting: Sports Medicine

## 2022-11-13 ENCOUNTER — Ambulatory Visit (INDEPENDENT_AMBULATORY_CARE_PROVIDER_SITE_OTHER): Payer: Medicare Other | Admitting: Sports Medicine

## 2022-11-13 ENCOUNTER — Ambulatory Visit: Payer: Medicare Other

## 2022-11-13 DIAGNOSIS — M50323 Other cervical disc degeneration at C6-C7 level: Secondary | ICD-10-CM | POA: Diagnosis not present

## 2022-11-13 DIAGNOSIS — M5412 Radiculopathy, cervical region: Secondary | ICD-10-CM

## 2022-11-13 DIAGNOSIS — M542 Cervicalgia: Secondary | ICD-10-CM

## 2022-11-13 MED ORDER — METHOCARBAMOL 500 MG PO TABS
500.0000 mg | ORAL_TABLET | Freq: Three times a day (TID) | ORAL | 0 refills | Status: DC
Start: 2022-11-13 — End: 2023-02-28

## 2022-11-13 MED ORDER — HYDROCODONE-ACETAMINOPHEN 5-325 MG PO TABS: 1.0000 | ORAL_TABLET | Freq: Four times a day (QID) | ORAL | 0 refills | Status: DC | PRN

## 2022-11-13 MED ORDER — METHYLPREDNISOLONE SODIUM SUCC 125 MG IJ SOLR
125.0000 mg | Freq: Once | INTRAMUSCULAR | Status: AC
Start: 2022-11-13 — End: 2022-11-13
  Administered 2022-11-13: 125 mg via INTRAVENOUS

## 2022-11-13 MED ORDER — PREDNISONE 50 MG PO TABS
ORAL_TABLET | ORAL | 0 refills | Status: DC
Start: 2022-11-13 — End: 2022-11-28

## 2022-11-13 NOTE — Progress Notes (Signed)
    Procedures performed today:    None.  Independent interpretation of notes and tests performed by another provider:   None.  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 Better with abduction of the left arm. Exam is benign, shoulder exam unrevealing. Suspect cervical radiculopathy. C6 distribution. Per his request he will get Solu-Medrol 125 intramuscular.  We will also do prednisone 50 daily for 5 days, methocarbamol at night, x-rays and home physical therapy. He does have a trip to [place] coming up in 3 weeks, we will do some hydrocodone for use as needed in the meantime and potentially on the plane if he is not feeling better.    ____________________________________________ Ihor Austin. Benjamin Stain, M.D., ABFM., CAQSM., AME. Primary Care and Sports Medicine  [facility]  Adjunct Professor of Family Medicine  [employer]  Restaurant manager, fast food

## 2022-11-13 NOTE — Assessment & Plan Note (Signed)
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 Better with abduction of the left arm. Exam is benign, shoulder exam unrevealing. Suspect cervical radiculopathy. C6 distribution. Per his request he will get Solu-Medrol 125 intramuscular.  We will also do prednisone 50 daily for 5 days, methocarbamol at night, x-rays and home physical therapy. He does have a trip to [place] coming up in 3 weeks, we will do some hydrocodone for use as needed in the meantime and potentially on the plane if he is not feeling better.

## 2022-11-16 ENCOUNTER — Other Ambulatory Visit: Payer: Self-pay | Admitting: Physician Assistant

## 2022-11-16 DIAGNOSIS — I10 Essential (primary) hypertension: Secondary | ICD-10-CM

## 2022-11-28 ENCOUNTER — Encounter: Payer: Self-pay | Admitting: Physician Assistant

## 2022-11-28 ENCOUNTER — Ambulatory Visit (INDEPENDENT_AMBULATORY_CARE_PROVIDER_SITE_OTHER): Payer: Medicare Other | Admitting: Physician Assistant

## 2022-11-28 VITALS — BP 132/56 | HR 84 | Ht 70.0 in | Wt 228.0 lb

## 2022-11-28 DIAGNOSIS — I700 Atherosclerosis of aorta: Secondary | ICD-10-CM | POA: Diagnosis not present

## 2022-11-28 DIAGNOSIS — Z23 Encounter for immunization: Secondary | ICD-10-CM

## 2022-11-28 DIAGNOSIS — R7303 Prediabetes: Secondary | ICD-10-CM

## 2022-11-28 DIAGNOSIS — I2510 Atherosclerotic heart disease of native coronary artery without angina pectoris: Secondary | ICD-10-CM | POA: Diagnosis not present

## 2022-11-28 DIAGNOSIS — N1831 Chronic kidney disease, stage 3a: Secondary | ICD-10-CM | POA: Insufficient documentation

## 2022-11-28 DIAGNOSIS — E1122 Type 2 diabetes mellitus with diabetic chronic kidney disease: Secondary | ICD-10-CM

## 2022-11-28 LAB — POCT GLYCOSYLATED HEMOGLOBIN (HGB A1C): Hemoglobin A1C: 6.7 % — AB (ref 4.0–5.6)

## 2022-11-28 MED ORDER — DAPAGLIFLOZIN PRO-METFORMIN ER 10-1000 MG PO TB24
1.0000 | ORAL_TABLET | Freq: Every day | ORAL | 0 refills | Status: DC
Start: 2022-11-28 — End: 2023-02-28

## 2022-11-28 NOTE — Progress Notes (Unsigned)
   Established Patient Office Visit  Subjective   Patient ID: Randall Hanson, male    DOB: 02/13/1950  Age: 71 y.o. MRN: [id number]  No chief complaint on file.   HPI Stress test Pre-diabetes swelling  {History (Optional):23778}  ROS    Objective:     There were no vitals taken for this visit. {Vitals History (Optional):23777}  Physical Exam   No results found for any visits on 11/28/22.  {Labs (Optional):23779}  The 10-year ASCVD risk score (Arnett DK, et al., 2019) is: 20.2%    Assessment & Plan:   Problem List Items Addressed This Visit       Unprioritized   Aortic atherosclerosis (HCC)   CAD in native artery   Left cervical radiculopathy - Primary    No follow-ups on file.    Tandy Gaw, PA-C

## 2022-11-28 NOTE — Patient Instructions (Signed)

## 2022-11-29 ENCOUNTER — Encounter: Payer: Self-pay | Admitting: Physician Assistant

## 2022-11-29 LAB — HM DIABETES EYE EXAM

## 2022-12-01 ENCOUNTER — Other Ambulatory Visit: Payer: Self-pay | Admitting: Physician Assistant

## 2022-12-01 ENCOUNTER — Encounter: Payer: Self-pay | Admitting: Physician Assistant

## 2022-12-01 MED ORDER — FARXIGA 10 MG PO TABS: 10.0000 mg | ORAL_TABLET | Freq: Every day | ORAL | 2 refills | Status: DC

## 2022-12-25 ENCOUNTER — Ambulatory Visit (INDEPENDENT_AMBULATORY_CARE_PROVIDER_SITE_OTHER): Payer: Medicare Other | Admitting: Sports Medicine

## 2022-12-25 ENCOUNTER — Encounter: Payer: Self-pay | Admitting: Sports Medicine

## 2022-12-25 DIAGNOSIS — M47816 Spondylosis without myelopathy or radiculopathy, lumbar region: Secondary | ICD-10-CM | POA: Diagnosis not present

## 2022-12-25 MED ORDER — PREDNISONE 10 MG (48) PO TBPK
ORAL_TABLET | Freq: Every day | ORAL | 0 refills | Status: DC
Start: 2022-12-25 — End: 2023-02-28

## 2022-12-25 NOTE — Assessment & Plan Note (Signed)
This is a very pleasant 72 year old male, he does have pain that has been traced to the bilateral L4-S1 facet joints, he did really well after facet joint injections in the distant past back in 2023. We have discussed facet medial branch blocks and RFA but he has not had enough pain to proceed with this. He is not having a flare in pain, will do a 12-day prednisone taper and if this fails we can do referral back to [facility] imaging for facet MBB/RFA.

## 2022-12-25 NOTE — Progress Notes (Signed)
    Procedures performed today:    None.  Independent interpretation of notes and tests performed by another provider:   None.  Brief History, Exam, Impression, and Recommendations:    Lumbar spondylosis This is a very pleasant 72 year old male, he does have pain that has been traced to the bilateral L4-S1 facet joints, he did really well after facet joint injections in the distant past back in 2023. We have discussed facet medial branch blocks and RFA but he has not had enough pain to proceed with this. He is not having a flare in pain, will do a 12-day prednisone taper and if this fails we can do referral back to [facility] imaging for facet MBB/RFA.    ____________________________________________ Ihor Austin. Benjamin Stain, M.D., ABFM., CAQSM., AME. Primary Care and Sports Medicine [facility]  Adjunct Professor of Family Medicine  [place] [employer] [facility] [employer]  Restaurant manager, fast food

## 2023-02-10 ENCOUNTER — Other Ambulatory Visit: Payer: Self-pay | Admitting: Physician Assistant

## 2023-02-10 DIAGNOSIS — I2510 Atherosclerotic heart disease of native coronary artery without angina pectoris: Secondary | ICD-10-CM

## 2023-02-10 DIAGNOSIS — R7303 Prediabetes: Secondary | ICD-10-CM

## 2023-02-10 DIAGNOSIS — I700 Atherosclerosis of aorta: Secondary | ICD-10-CM

## 2023-02-10 IMAGING — DX DG LUMBAR SPINE COMPLETE 4+V
5 series · 5 of 5 positions shown · non-contrast
Comparison: None.

CLINICAL DATA: Low back pain

EXAM:
LUMBAR SPINE - COMPLETE 4+ VIEW

[l-spine ap]
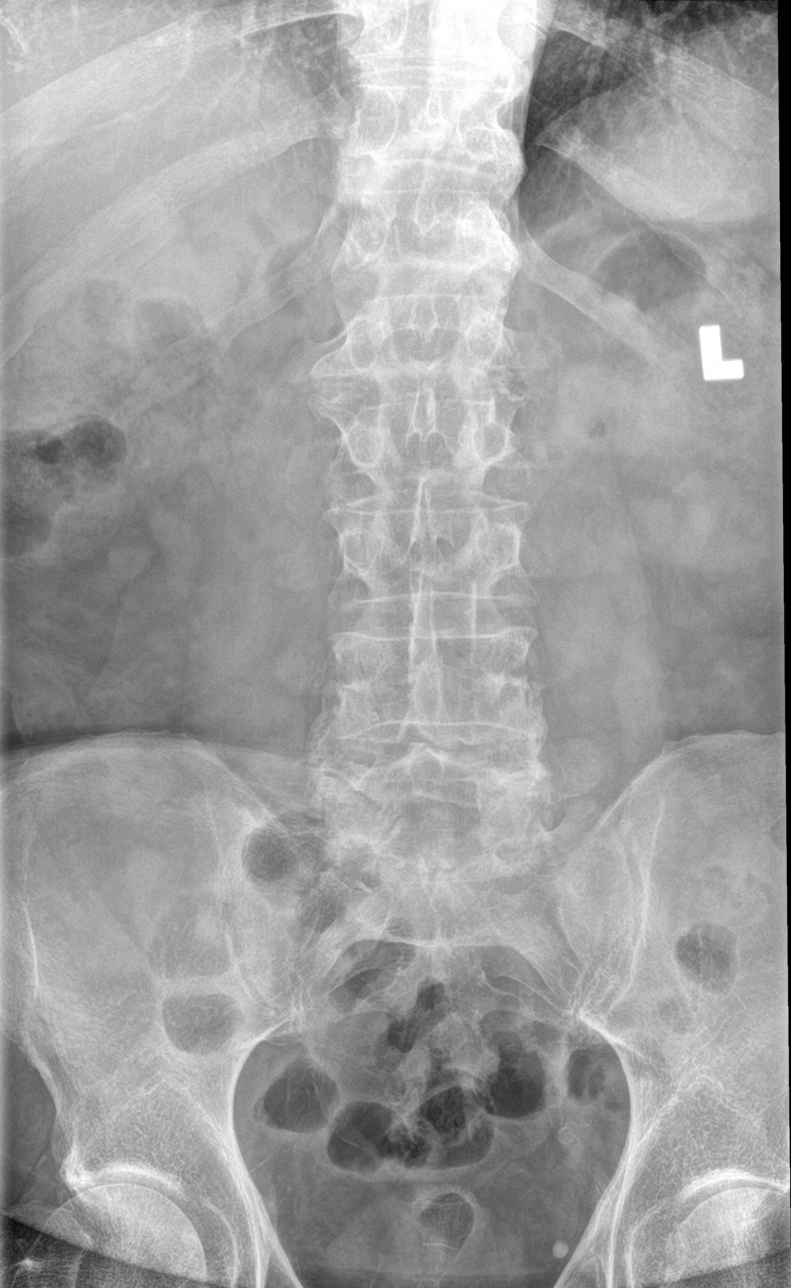

[l-spine obl (1 of 2)]
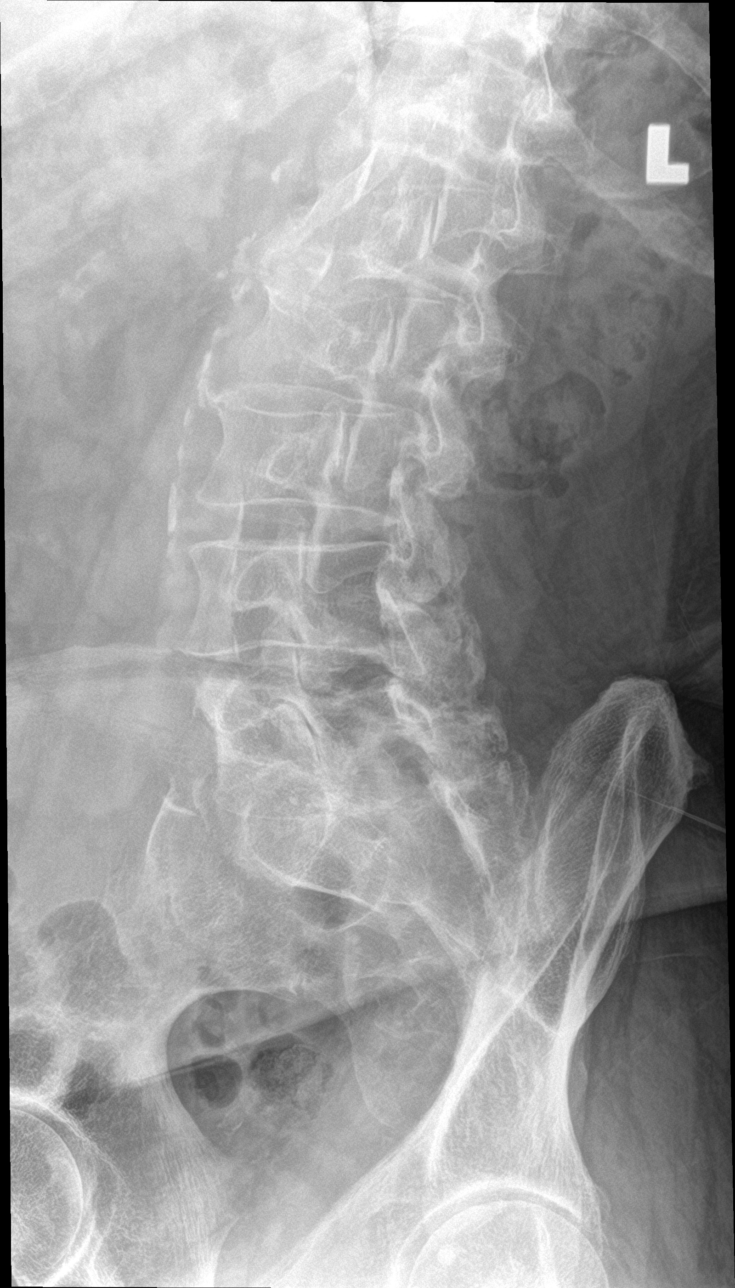

[l-spine obl (2 of 2)]
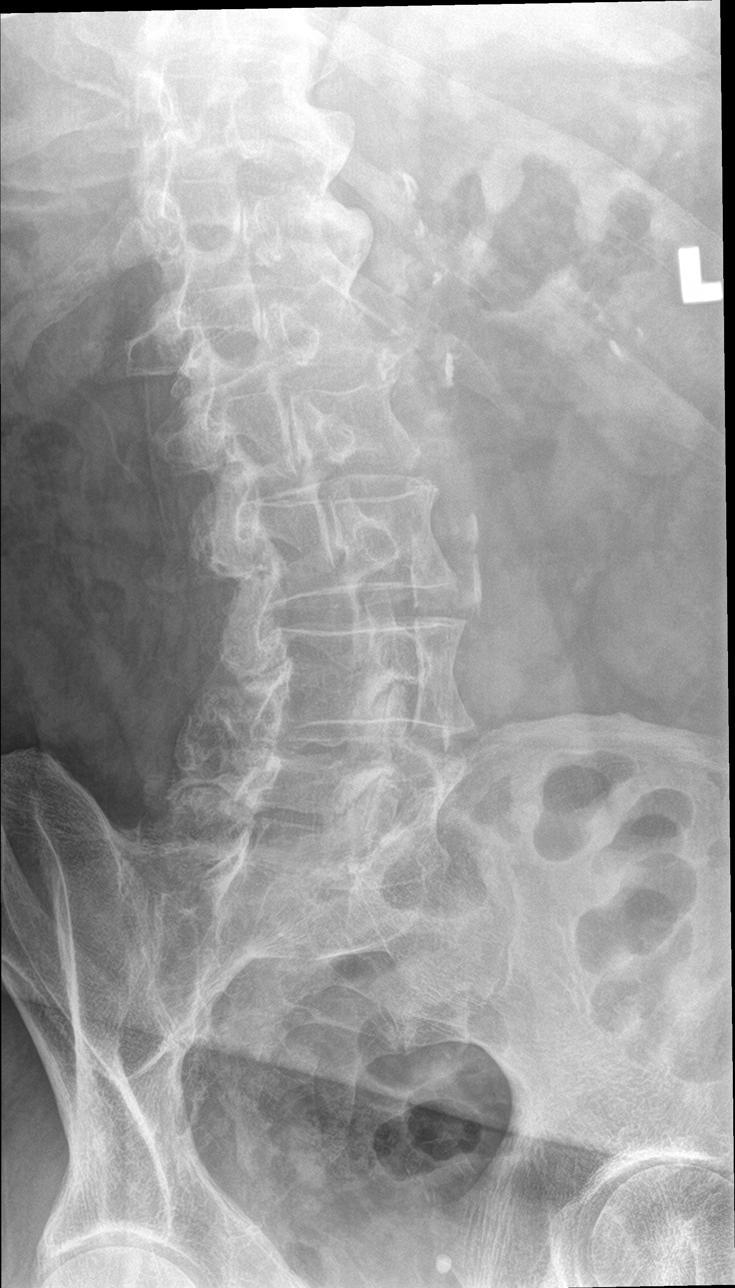

[l-spine lat]
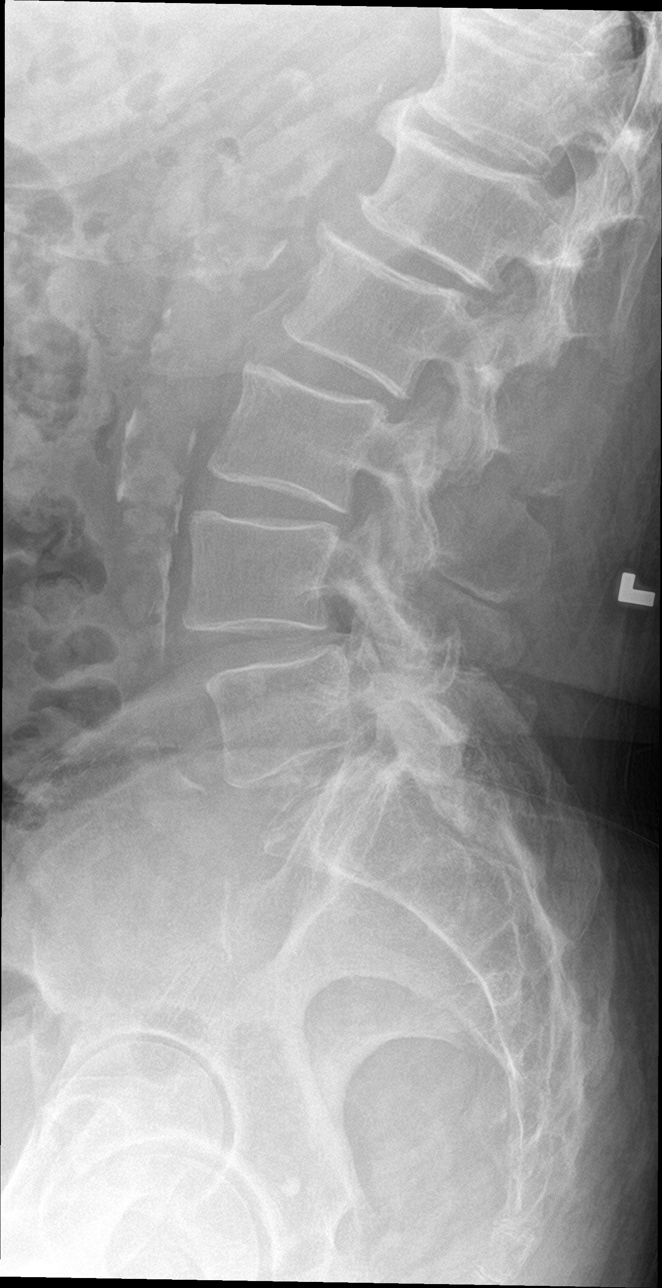

[l-spine spot]
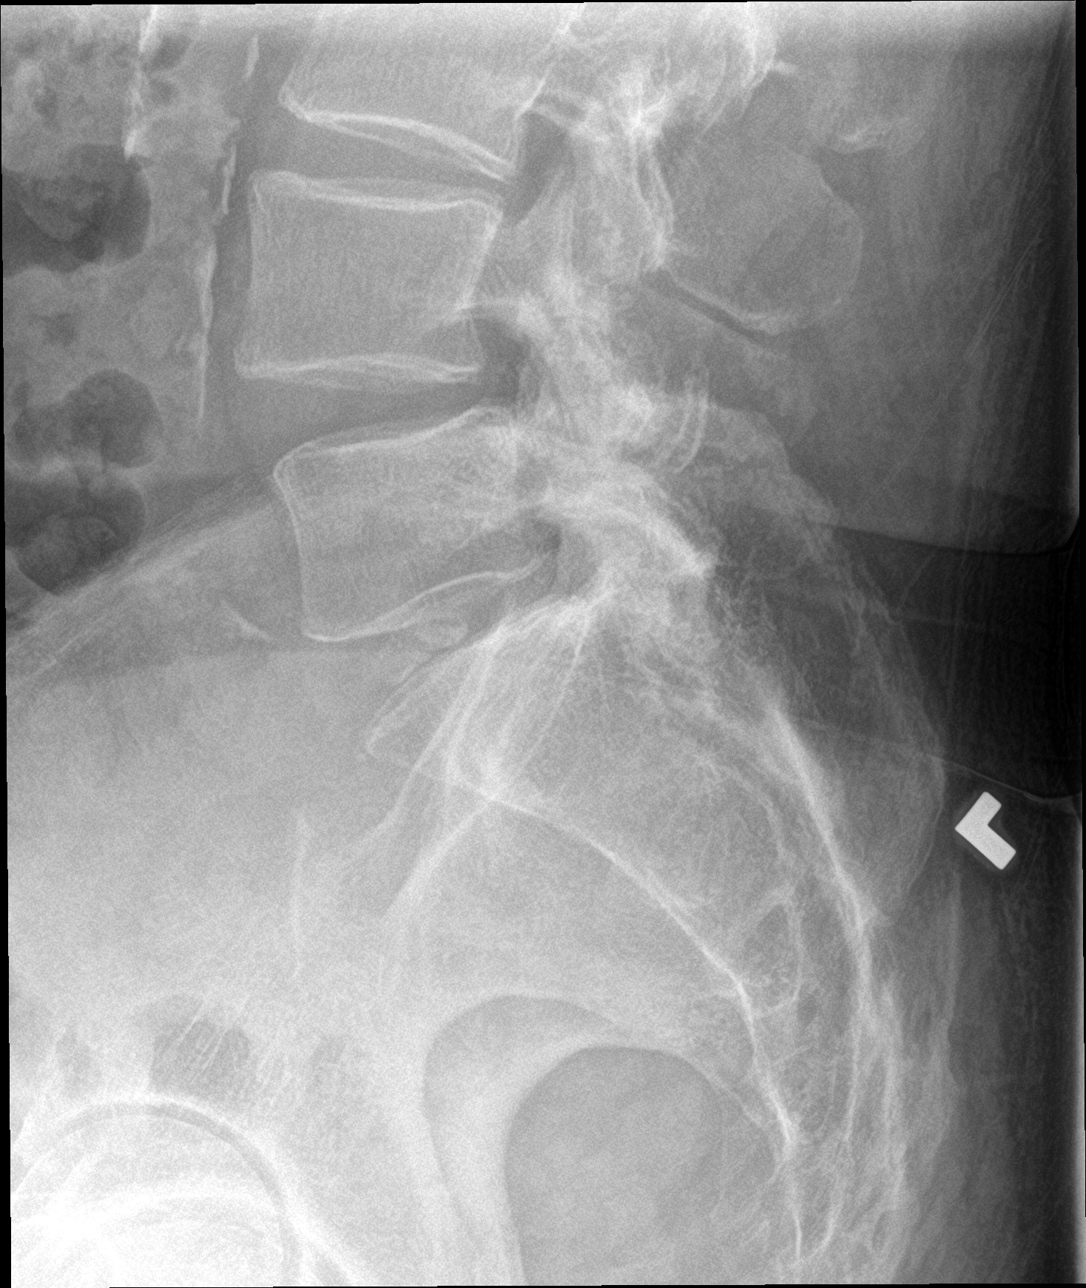

[5 of 5 positions shown; findings below may reference images not displayed]

FINDINGS: Grade 1 anterolisthesis L4 on L5. Vertebral body heights are
maintained. Disc spaces are patent. Mild degenerative osteophytes at
multiple levels. Facet degenerative changes of the lower lumbar
spine. Aortic atherosclerosis.
IMPRESSION: Grade 1 anterolisthesis L4 on L5 with facet degenerative changes of
lower lumbar spine. No acute osseous abnormality

## 2023-02-21 ENCOUNTER — Other Ambulatory Visit: Payer: Self-pay | Admitting: Physician Assistant

## 2023-02-28 ENCOUNTER — Encounter: Payer: Self-pay | Admitting: Physician Assistant

## 2023-02-28 ENCOUNTER — Ambulatory Visit (INDEPENDENT_AMBULATORY_CARE_PROVIDER_SITE_OTHER): Payer: Medicare Other | Admitting: Physician Assistant

## 2023-02-28 VITALS — BP 139/54 | HR 74 | Ht 70.0 in | Wt 228.0 lb

## 2023-02-28 DIAGNOSIS — M47816 Spondylosis without myelopathy or radiculopathy, lumbar region: Secondary | ICD-10-CM | POA: Diagnosis not present

## 2023-02-28 DIAGNOSIS — R809 Proteinuria, unspecified: Secondary | ICD-10-CM

## 2023-02-28 DIAGNOSIS — M4316 Spondylolisthesis, lumbar region: Secondary | ICD-10-CM

## 2023-02-28 DIAGNOSIS — E1122 Type 2 diabetes mellitus with diabetic chronic kidney disease: Secondary | ICD-10-CM

## 2023-02-28 DIAGNOSIS — I2510 Atherosclerotic heart disease of native coronary artery without angina pectoris: Secondary | ICD-10-CM

## 2023-02-28 DIAGNOSIS — I10 Essential (primary) hypertension: Secondary | ICD-10-CM

## 2023-02-28 DIAGNOSIS — N1831 Chronic kidney disease, stage 3a: Secondary | ICD-10-CM

## 2023-02-28 DIAGNOSIS — G894 Chronic pain syndrome: Secondary | ICD-10-CM

## 2023-02-28 LAB — POCT UA - MICROALBUMIN
Creatinine, POC: 50 mg/dL
Microalbumin Ur, POC: 30 mg/L

## 2023-02-28 LAB — POCT GLYCOSYLATED HEMOGLOBIN (HGB A1C): Hemoglobin A1C: 5.9 % — AB (ref 4.0–5.6)

## 2023-02-28 MED ORDER — HYDROCHLOROTHIAZIDE 12.5 MG PO TABS: 12.5000 mg | ORAL_TABLET | Freq: Every day | ORAL | 1 refills | Status: DC

## 2023-02-28 MED ORDER — LISINOPRIL 40 MG PO TABS: 40.0000 mg | ORAL_TABLET | Freq: Every day | ORAL | 1 refills | Status: DC

## 2023-02-28 MED ORDER — TRAMADOL HCL 50 MG PO TABS: 50.0000 mg | ORAL_TABLET | Freq: Two times a day (BID) | ORAL | 0 refills | Status: AC | PRN

## 2023-02-28 NOTE — Patient Instructions (Addendum)
Glucosamine Chondroitin for joints Tramadol as needed

## 2023-02-28 NOTE — Progress Notes (Signed)
Established Patient Office Visit  Subjective   Patient ID: Randall Hanson, male    DOB: 17-May-1950  Age: 73 y.o. MRN: [id number]  Chief Complaint  Patient presents with   Medical Management of Chronic Issues    Last A1c 6.7    HPI Patient is a 73 year old male presents for a diabetes follow up. His A1C in November 2024 was 6.7%. Denies COVID vaccine today. A1C: 5.9% today. He has not been checking in blood pressure at home today. He is trying to eat healthier and stay away from sweets. He states he has been doing well and does not have any concerns today.   He does endorses some arthritis in his knees and hips. He also says he has pain in his neck and back. He states that he has been exercising and riding his stationary bike. He states he cannot walk very far without pain.   Review of Systems  Constitutional: Negative.   HENT: Negative.    Eyes: Negative.   Respiratory: Negative.    Cardiovascular:  Negative for leg swelling.  Gastrointestinal: Negative.   Genitourinary: Negative.   Musculoskeletal:  Positive for back pain, joint pain, myalgias and neck pain.  Skin: Negative.   Neurological: Negative.   Psychiatric/Behavioral: Negative.       Objective:     BP (!) 139/54   Pulse 74   Ht 5\' 10"  (1.778 m)   Wt 228 lb (103.4 kg)   SpO2 99%   BMI 32.71 kg/m  BP Readings from Last 3 Encounters:  02/28/23 (!) 139/54  11/28/22 (!) 132/56  08/28/22 119/62   Wt Readings from Last 3 Encounters:  02/28/23 228 lb (103.4 kg)  11/28/22 228 lb (103.4 kg)  08/31/22 221 lb (100.2 kg)   Physical Exam Constitutional:      Appearance: Normal appearance. He is obese.  HENT:     Head: Normocephalic and atraumatic.     Nose: Nose normal.  Eyes:     Extraocular Movements: Extraocular movements intact.  Cardiovascular:     Rate and Rhythm: Normal rate and regular rhythm.     Pulses: Normal pulses.     Heart sounds: Normal heart sounds.  Pulmonary:     Effort: Pulmonary  effort is normal.     Breath sounds: Normal breath sounds.  Musculoskeletal:     Cervical back: Neck supple.  Skin:    General: Skin is warm and dry.  Neurological:     Mental Status: He is alert.  Psychiatric:        Mood and Affect: Mood normal.    Results for orders placed or performed in visit on 02/28/23  POCT HgB A1C  Result Value Ref Range   Hemoglobin A1C 5.9 (A) 4.0 - 5.6 %   HbA1c POC (<> result, manual entry)     HbA1c, POC (prediabetic range)     HbA1c, POC (controlled diabetic range)    POCT UA - Microalbumin  Result Value Ref Range   Microalbumin Ur, POC 30 mg/L   Creatinine, POC 50 mg/dL   Albumin/Creatinine Ratio, Urine, POC 30-300    The 10-year ASCVD risk score (Arnett DK, et al., 2019) is: 46.1%   Assessment & Plan:  Marland KitchenMarland KitchenOzie was seen today for medical management of chronic issues.  Diagnoses and all orders for this visit:  Type 2 diabetes mellitus with stage 3a chronic kidney disease, without long-term current use of insulin (HCC) -     POCT HgB A1C -  POCT UA - Microalbumin  Hypertension goal BP (blood pressure) < 130/80 -     lisinopril (ZESTRIL) 40 MG tablet; Take 1 tablet (40 mg total) by mouth daily. -     hydrochlorothiazide (HYDRODIURIL) 12.5 MG tablet; Take 1 tablet (12.5 mg total) by mouth daily.  Spondylolisthesis at L4-L5 level -     traMADol (ULTRAM) 50 MG tablet; Take 1 tablet (50 mg total) by mouth every 12 (twelve) hours as needed.  Lumbar spondylosis  Chronic pain syndrome  Benign essential hypertension -     hydrochlorothiazide (HYDRODIURIL) 12.5 MG tablet; Take 1 tablet (12.5 mg total) by mouth daily.  CAD in native artery  Microalbuminuria  A1C to goal today Continue same medications BP not quite to goal Microalbumin abnormal-on farxiga and lisinopril. He is only taking NSAIDs as needed  On statin Foot exam UTD .Marland Kitchen Diabetic Foot Exam - Simple   Simple Foot Form Diabetic Foot exam was performed with the following  findings: Yes 02/28/2023  9:17 AM  Visual Inspection No deformities, no ulcerations, no other skin breakdown bilaterally: Yes Sensation Testing Intact to touch and monofilament testing bilaterally: Yes Pulse Check Posterior Tibialis and Dorsalis pulse intact bilaterally: Yes Comments     Eye exam UTD Follow up in 3 months.    Discussed patients arthritis-he is using PRN tylenol arthritis he cannot take daily NSAIDs due to CKD-III. He has tolerated and benefited from Tramadol in the past will refill for break through pain. Discussed other supplements as well that could help with arthritis.  Marland KitchenMarland KitchenPDMP reviewed during this encounter. No concerns.     Tandy Gaw, PA-C

## 2023-05-01 ENCOUNTER — Other Ambulatory Visit: Payer: Self-pay | Admitting: Physician Assistant

## 2023-05-01 DIAGNOSIS — F411 Generalized anxiety disorder: Secondary | ICD-10-CM

## 2023-05-02 NOTE — Telephone Encounter (Signed)
 Last OV: 02/28/23 Next OV: 05/29/23 Last RF: 09/22/22

## 2023-05-02 NOTE — Progress Notes (Signed)
 [facility] Quality Team Note  Name: Randall Hanson Date of Birth: 1950/04/03 MRN: [id number] Date: 05/02/2023  [facility] Quality Team has reviewed this patient's chart, please see recommendations below:  [facility] Quality Other; (Patient needs eGFR completed at next office visit 05/29/2023)

## 2023-05-10 DIAGNOSIS — G4733 Obstructive sleep apnea (adult) (pediatric): Secondary | ICD-10-CM | POA: Diagnosis not present

## 2023-05-10 DIAGNOSIS — J3089 Other allergic rhinitis: Secondary | ICD-10-CM | POA: Diagnosis not present

## 2023-05-21 ENCOUNTER — Other Ambulatory Visit: Payer: Self-pay | Admitting: Physician Assistant

## 2023-05-21 DIAGNOSIS — H524 Presbyopia: Secondary | ICD-10-CM | POA: Diagnosis not present

## 2023-05-21 DIAGNOSIS — D3132 Benign neoplasm of left choroid: Secondary | ICD-10-CM | POA: Diagnosis not present

## 2023-05-21 DIAGNOSIS — H35363 Drusen (degenerative) of macula, bilateral: Secondary | ICD-10-CM | POA: Diagnosis not present

## 2023-05-25 ENCOUNTER — Other Ambulatory Visit: Payer: Self-pay | Admitting: Physician Assistant

## 2023-05-25 DIAGNOSIS — M4316 Spondylolisthesis, lumbar region: Secondary | ICD-10-CM

## 2023-05-29 ENCOUNTER — Encounter: Payer: Self-pay | Admitting: Physician Assistant

## 2023-05-29 ENCOUNTER — Ambulatory Visit (INDEPENDENT_AMBULATORY_CARE_PROVIDER_SITE_OTHER): Payer: Medicare Other | Admitting: Physician Assistant

## 2023-05-29 VITALS — BP 120/58 | HR 69 | Temp 98.0°F | Ht 70.0 in | Wt 219.0 lb

## 2023-05-29 DIAGNOSIS — I2510 Atherosclerotic heart disease of native coronary artery without angina pectoris: Secondary | ICD-10-CM

## 2023-05-29 DIAGNOSIS — I10 Essential (primary) hypertension: Secondary | ICD-10-CM

## 2023-05-29 DIAGNOSIS — M19041 Primary osteoarthritis, right hand: Secondary | ICD-10-CM

## 2023-05-29 DIAGNOSIS — T148XXA Other injury of unspecified body region, initial encounter: Secondary | ICD-10-CM | POA: Diagnosis not present

## 2023-05-29 DIAGNOSIS — N1831 Chronic kidney disease, stage 3a: Secondary | ICD-10-CM

## 2023-05-29 DIAGNOSIS — D508 Other iron deficiency anemias: Secondary | ICD-10-CM

## 2023-05-29 DIAGNOSIS — I700 Atherosclerosis of aorta: Secondary | ICD-10-CM

## 2023-05-29 DIAGNOSIS — M19042 Primary osteoarthritis, left hand: Secondary | ICD-10-CM | POA: Diagnosis not present

## 2023-05-29 DIAGNOSIS — M4316 Spondylolisthesis, lumbar region: Secondary | ICD-10-CM | POA: Diagnosis not present

## 2023-05-29 DIAGNOSIS — R809 Proteinuria, unspecified: Secondary | ICD-10-CM

## 2023-05-29 DIAGNOSIS — E1122 Type 2 diabetes mellitus with diabetic chronic kidney disease: Secondary | ICD-10-CM | POA: Diagnosis not present

## 2023-05-29 DIAGNOSIS — E66811 Obesity, class 1: Secondary | ICD-10-CM

## 2023-05-29 DIAGNOSIS — M47816 Spondylosis without myelopathy or radiculopathy, lumbar region: Secondary | ICD-10-CM

## 2023-05-29 DIAGNOSIS — Z23 Encounter for immunization: Secondary | ICD-10-CM | POA: Diagnosis not present

## 2023-05-29 DIAGNOSIS — G894 Chronic pain syndrome: Secondary | ICD-10-CM | POA: Diagnosis not present

## 2023-05-29 DIAGNOSIS — E785 Hyperlipidemia, unspecified: Secondary | ICD-10-CM | POA: Diagnosis not present

## 2023-05-29 LAB — POCT GLYCOSYLATED HEMOGLOBIN (HGB A1C): Hemoglobin A1C: 5.7 % — AB (ref 4.0–5.6)

## 2023-05-29 MED ORDER — AMMONIUM LACTATE 12 % EX LOTN: 1.0000 | TOPICAL_LOTION | CUTANEOUS | 3 refills | Status: AC | PRN

## 2023-05-29 MED ORDER — TRAMADOL HCL 50 MG PO TABS: 50.0000 mg | ORAL_TABLET | Freq: Two times a day (BID) | ORAL | 0 refills | Status: DC | PRN

## 2023-05-29 MED ORDER — DICLOFENAC SODIUM 1 % EX GEL
4.0000 g | Freq: Four times a day (QID) | CUTANEOUS | 1 refills | Status: AC
Start: 2023-05-29 — End: ?

## 2023-05-30 ENCOUNTER — Ambulatory Visit: Payer: Self-pay | Admitting: Physician Assistant

## 2023-05-30 ENCOUNTER — Encounter: Payer: Self-pay | Admitting: Physician Assistant

## 2023-05-30 DIAGNOSIS — E66811 Obesity, class 1: Secondary | ICD-10-CM | POA: Insufficient documentation

## 2023-05-30 DIAGNOSIS — D508 Other iron deficiency anemias: Secondary | ICD-10-CM | POA: Insufficient documentation

## 2023-05-30 DIAGNOSIS — M19041 Primary osteoarthritis, right hand: Secondary | ICD-10-CM | POA: Insufficient documentation

## 2023-05-30 LAB — CBC WITH DIFFERENTIAL/PLATELET
Basophils Absolute: 0.1 10*3/uL (ref 0.0–0.2)
Basos: 1 %
EOS (ABSOLUTE): 0.2 10*3/uL (ref 0.0–0.4)
Eos: 3 %
Hematocrit: 40.5 % (ref 37.5–51.0)
Hemoglobin: 13.3 g/dL (ref 13.0–17.7)
Immature Grans (Abs): 0 10*3/uL (ref 0.0–0.1)
Immature Granulocytes: 0 %
Lymphocytes Absolute: 1.4 10*3/uL (ref 0.7–3.1)
Lymphs: 22 %
MCH: 30.9 pg (ref 26.6–33.0)
MCHC: 32.8 g/dL (ref 31.5–35.7)
MCV: 94 fL (ref 79–97)
Monocytes Absolute: 0.6 10*3/uL (ref 0.1–0.9)
Monocytes: 9 %
Neutrophils Absolute: 4.1 10*3/uL (ref 1.4–7.0)
Neutrophils: 65 %
Platelets: 253 10*3/uL (ref 150–450)
RBC: 4.3 x10E6/uL (ref 4.14–5.80)
RDW: 13.2 % (ref 11.6–15.4)
WBC: 6.3 10*3/uL (ref 3.4–10.8)

## 2023-05-30 LAB — CMP14+EGFR
ALT: 13 IU/L (ref 0–44)
AST: 17 IU/L (ref 0–40)
Albumin: 4.4 g/dL (ref 3.8–4.8)
Alkaline Phosphatase: 62 IU/L (ref 44–121)
BUN/Creatinine Ratio: 14 (ref 10–24)
BUN: 20 mg/dL (ref 8–27)
Bilirubin Total: 0.4 mg/dL (ref 0.0–1.2)
CO2: 21 mmol/L (ref 20–29)
Calcium: 9.3 mg/dL (ref 8.6–10.2)
Chloride: 104 mmol/L (ref 96–106)
Creatinine, Ser: 1.48 mg/dL — ABNORMAL HIGH (ref 0.76–1.27)
Globulin, Total: 1.8 g/dL (ref 1.5–4.5)
Glucose: 93 mg/dL (ref 70–99)
Potassium: 4.9 mmol/L (ref 3.5–5.2)
Sodium: 139 mmol/L (ref 134–144)
Total Protein: 6.2 g/dL (ref 6.0–8.5)
eGFR: 50 mL/min/{1.73_m2} — ABNORMAL LOW (ref >59)

## 2023-05-30 LAB — IRON,TIBC AND FERRITIN PANEL
Ferritin: 82 ng/mL (ref 30–400)
Iron Saturation: 26 % (ref 15–55)
Iron: 78 ug/dL (ref 38–169)
Total Iron Binding Capacity: 304 ug/dL (ref 250–450)
UIBC: 226 ug/dL (ref 111–343)

## 2023-05-30 LAB — LIPID PANEL
Chol/HDL Ratio: 4.3 ratio (ref 0.0–5.0)
Cholesterol, Total: 129 mg/dL (ref 100–199)
HDL: 30 mg/dL — ABNORMAL LOW (ref >39)
LDL Chol Calc (NIH): 77 mg/dL (ref 0–99)
Triglycerides: 121 mg/dL (ref 0–149)
VLDL Cholesterol Cal: 22 mg/dL (ref 5–40)

## 2023-05-30 MED ORDER — TRAMADOL HCL 50 MG PO TABS: 50.0000 mg | ORAL_TABLET | Freq: Two times a day (BID) | ORAL | 0 refills | Status: DC | PRN

## 2023-05-30 MED ORDER — TRAMADOL HCL 50 MG PO TABS: 50.0000 mg | ORAL_TABLET | Freq: Two times a day (BID) | ORAL | 0 refills | Status: AC | PRN

## 2023-05-30 NOTE — Progress Notes (Signed)
 Established Patient Office Visit  Subjective   Patient ID: Randall Hanson, male    DOB: [birth year]  Age: 73 y.o. MRN: [id number]  Chief Complaint  Patient presents with   Medical Management of Chronic Issues    Type 2 diabetes mellitus with stage 3a chronic kidney disease, without long-term current use of insulin last A1c 5.9    HPI Pt is a 73 yo obese male with T2DM, HLD, CAD, Chronic pain syndrome, IDA, MDD who presents to the clinic for 3 month follow up.   Pt is doing really well. He is not checking sugars. He is compliant with medications. He is very active playing golf and workin in his yard. He does struggle with pain and achy joints. Mostly hands and low back. He is on cymbalta  and has tramadol  to use as needed. Tramadol  is helpful.   His mood is good with no concerns.   He denies any CP, palpitations, headaches or vision changes.   Patient Active Problem List   Diagnosis Date Noted   Iron deficiency anemia secondary to inadequate dietary iron intake 05/30/2023   Spondylolisthesis at L4-L5 level 02/28/2023   Microalbuminuria 02/28/2023   Type 2 diabetes mellitus with stage 3a chronic kidney disease, without long-term current use of insulin (HCC) 11/28/2022   Left cervical radiculopathy 11/13/2022   Left ankle swelling 08/28/2022   Status post right hip replacement 08/28/2022   CAD in native artery 07/11/2022   Weight gain 02/24/2022   Hypertension goal BP (blood pressure) < 130/80 10/11/2021   History of suicide attempt 09/27/2021   MDD (major depressive disorder), recurrent episode, moderate (HCC) 09/05/2021   Suicide attempt by multiple drug overdose (HCC) 09/04/2021   AKI (acute kidney injury) (HCC) 09/04/2021   Aspiration pneumonitis (HCC) 09/04/2021   Depressed mood 02/23/2021   Chronic pain syndrome 02/23/2021   Vitamin D insufficiency 12/22/2020   Aortic atherosclerosis (HCC) 09/21/2020   OSA (obstructive sleep apnea) 09/16/2020   Sensorineural hearing  loss (SNHL), bilateral 09/16/2020   Insomnia 09/16/2020   Primary osteoarthritis of left hip 09/16/2020   Lumbar spondylosis 09/16/2020   Anxiety disorder, unspecified 09/16/2020   Benign essential hypertension 08/02/2020   Prediabetes 11/17/2019   Combined forms of age-related cataract of left eye 07/24/2019   Personal history of pulmonary embolism 05/22/2018   Prostate cancer (HCC) 05/30/2016   Vitamin B 12 deficiency 10/23/2015   Diverticulosis of large intestine without hemorrhage 06/21/2015   Tubular adenoma of colon 06/21/2015   History of total knee arthroplasty, right 06/01/2014   Dyslipidemia 02/15/2014   ED (erectile dysfunction) 12/29/2011   Past Medical History:  Diagnosis Date   Anxiety    Arthritis    Colon polyps    High cholesterol    History of kidney stones    HOH (hard of hearing)    has Hearing Aids   Hypertension    Joint pain    Pre-diabetes    Prostate cancer (HCC)    Sleep apnea    Past Surgical History:  Procedure Laterality Date   JOINT REPLACEMENT     LASIK Bilateral    PROSTATECTOMY     REPLACEMENT TOTAL KNEE Right 2015   done at [facility] in [place]   SHOULDER SURGERY Right    x3  arthroscopies  rotator cuff and labral repair   TOTAL HIP ARTHROPLASTY Left 06/26/2022   Procedure: TOTAL HIP ARTHROPLASTY ANTERIOR APPROACH;  Surgeon: Neil Balls, MD;  Location: [facility];  Service: Orthopedics;  Laterality: Left;  TOTAL HIP ARTHROPLASTY Right    WISDOM TOOTH EXTRACTION     Family History  Problem Relation Age of Onset   Cancer Father    Allergies  Allergen Reactions   Mirtazapine      "Made me feel awful"   Trazodone  And Nefazodone     Vision blurry/eyes water    Xigduo  Xr [Dapagliflozin  Pro-Metformin  Er]     diarrhea      Review of Systems  All other systems reviewed and are negative.     Objective:      BP (!) 120/58   Pulse 69   Temp 98 F (36.7 C) (Oral)   Ht 5\' 10"  (1.778 m)   Wt 219 lb (99.3 kg)   SpO2 99%    BMI 31.42 kg/m  BP Readings from Last 3 Encounters:  05/29/23 (!) 120/58  02/28/23 (!) 139/54  11/28/22 (!) 132/56   Wt Readings from Last 3 Encounters:  05/29/23 219 lb (99.3 kg)  02/28/23 228 lb (103.4 kg)  11/28/22 228 lb (103.4 kg)      Physical Exam Constitutional:      Appearance: Normal appearance. He is obese.  HENT:     Head: Normocephalic.  Cardiovascular:     Rate and Rhythm: Normal rate and regular rhythm.     Pulses: Normal pulses.     Heart sounds: Normal heart sounds. No murmur heard. Pulmonary:     Effort: Pulmonary effort is normal.     Breath sounds: Normal breath sounds.  Musculoskeletal:     Right lower leg: No edema.     Left lower leg: No edema.  Neurological:     General: No focal deficit present.     Mental Status: He is alert and oriented to person, place, and time.  Psychiatric:        Mood and Affect: Mood normal.      Results for orders placed or performed in visit on 05/29/23  POCT HgB A1C  Result Value Ref Range   Hemoglobin A1C 5.7 (A) 4.0 - 5.6 %   HbA1c POC (<> result, manual entry)     HbA1c, POC (prediabetic range)     HbA1c, POC (controlled diabetic range)       The 10-year ASCVD risk score (Arnett DK, et al., 2019) is: 37.8%    Assessment & Plan:   Aaron AasAaron AasKaulana was seen today for medical management of chronic issues.  Diagnoses and all orders for this visit:  Type 2 diabetes mellitus with stage 3a chronic kidney disease, without long-term current use of insulin (HCC) -     CMP14+EGFR -     POCT HgB A1C  Hypertension goal BP (blood pressure) < 130/80 -     CMP14+EGFR  Dyslipidemia -     Lipid panel  Iron deficiency anemia secondary to inadequate dietary iron intake -     CBC w/Diff/Platelet -     Fe+TIBC+Fer  CAD in native artery  Microalbuminuria  Chronic pain syndrome -     traMADol  (ULTRAM ) 50 MG tablet; Take 1 tablet (50 mg total) by mouth every 12 (twelve) hours as needed for up to 5 days. -     traMADol   (ULTRAM ) 50 MG tablet; Take 1 tablet (50 mg total) by mouth every 12 (twelve) hours as needed for up to 5 days.  Spondylolisthesis at L4-L5 level -     traMADol  (ULTRAM ) 50 MG tablet; Take 1 tablet (50 mg total) by mouth every 12 (twelve) hours as needed for up  to 5 days. -     traMADol  (ULTRAM ) 50 MG tablet; Take 1 tablet (50 mg total) by mouth every 12 (twelve) hours as needed for up to 5 days.  Lumbar spondylosis -     traMADol  (ULTRAM ) 50 MG tablet; Take 1 tablet (50 mg total) by mouth every 12 (twelve) hours as needed for up to 5 days. -     traMADol  (ULTRAM ) 50 MG tablet; Take 1 tablet (50 mg total) by mouth every 12 (twelve) hours as needed for up to 5 days.  Aortic atherosclerosis (HCC)  Arthritis of both hands -     diclofenac  Sodium (VOLTAREN ) 1 % GEL; Apply 4 g topically 4 (four) times daily. To affected joint. -     traMADol  (ULTRAM ) 50 MG tablet; Take 1 tablet (50 mg total) by mouth every 12 (twelve) hours as needed for up to 5 days. -     traMADol  (ULTRAM ) 50 MG tablet; Take 1 tablet (50 mg total) by mouth every 12 (twelve) hours as needed for up to 5 days.  Abrasion of skin -     Tdap vaccine greater than or equal to 7yo IM  Other orders -     Discontinue: traMADol  (ULTRAM ) 50 MG tablet; Take 1 tablet (50 mg total) by mouth every 12 (twelve) hours as needed for up to 5 days.  A1C to goal at 5.7.  Continue farxiga  BP to goal On statin Microalbumin on ACE and SGLT-2 Check cmp today Eye and foot exam UTD Vaccines UTD Tdap given today due to need and abrasions from working outside on bilateral hands and arms  .Aaron AasPDMP reviewed during this encounter. No concerns Tramadol  refill for 3 months to take bid as needed Diclofenac  for hand arthritis and other joints HO given with hand exercises Continue on cymbalta     Return in about 3 months (around 08/29/2023).    Symiah Nowotny, PA-C

## 2023-05-30 NOTE — Progress Notes (Signed)
 Randall Hanson,   Kidney function improved some. GREAT news.  Hemoglobin improved and in normal range.  Cholesterol improved.   All labs are looking better!

## 2023-06-07 DIAGNOSIS — D485 Neoplasm of uncertain behavior of skin: Secondary | ICD-10-CM | POA: Diagnosis not present

## 2023-06-07 DIAGNOSIS — C44511 Basal cell carcinoma of skin of breast: Secondary | ICD-10-CM | POA: Diagnosis not present

## 2023-06-07 DIAGNOSIS — Z129 Encounter for screening for malignant neoplasm, site unspecified: Secondary | ICD-10-CM | POA: Diagnosis not present

## 2023-06-07 DIAGNOSIS — L821 Other seborrheic keratosis: Secondary | ICD-10-CM | POA: Diagnosis not present

## 2023-06-07 DIAGNOSIS — D1801 Hemangioma of skin and subcutaneous tissue: Secondary | ICD-10-CM | POA: Diagnosis not present

## 2023-06-07 DIAGNOSIS — C4441 Basal cell carcinoma of skin of scalp and neck: Secondary | ICD-10-CM | POA: Diagnosis not present

## 2023-06-07 DIAGNOSIS — L570 Actinic keratosis: Secondary | ICD-10-CM | POA: Diagnosis not present

## 2023-06-11 ENCOUNTER — Other Ambulatory Visit: Payer: Self-pay | Admitting: Physician Assistant

## 2023-07-11 DIAGNOSIS — C4441 Basal cell carcinoma of skin of scalp and neck: Secondary | ICD-10-CM | POA: Diagnosis not present

## 2023-07-11 DIAGNOSIS — C44511 Basal cell carcinoma of skin of breast: Secondary | ICD-10-CM | POA: Diagnosis not present

## 2023-07-11 DIAGNOSIS — L820 Inflamed seborrheic keratosis: Secondary | ICD-10-CM | POA: Diagnosis not present

## 2023-07-17 DIAGNOSIS — L905 Scar conditions and fibrosis of skin: Secondary | ICD-10-CM | POA: Diagnosis not present

## 2023-08-10 ENCOUNTER — Other Ambulatory Visit: Payer: Self-pay | Admitting: Physician Assistant

## 2023-08-10 DIAGNOSIS — G894 Chronic pain syndrome: Secondary | ICD-10-CM

## 2023-08-10 DIAGNOSIS — I700 Atherosclerosis of aorta: Secondary | ICD-10-CM

## 2023-08-10 DIAGNOSIS — R7303 Prediabetes: Secondary | ICD-10-CM

## 2023-08-10 DIAGNOSIS — M4316 Spondylolisthesis, lumbar region: Secondary | ICD-10-CM

## 2023-08-10 DIAGNOSIS — M47816 Spondylosis without myelopathy or radiculopathy, lumbar region: Secondary | ICD-10-CM

## 2023-08-10 DIAGNOSIS — M19041 Primary osteoarthritis, right hand: Secondary | ICD-10-CM

## 2023-08-10 DIAGNOSIS — I2510 Atherosclerotic heart disease of native coronary artery without angina pectoris: Secondary | ICD-10-CM

## 2023-08-10 NOTE — Telephone Encounter (Signed)
 Please call the pharmacy and verify.  It looks like based on her last notes she had actually sent a prescription for tramadol  for July 17.  So not sure if they still have that on file or if patient already picked it up and this is a prescription request for August?

## 2023-08-15 ENCOUNTER — Telehealth: Payer: Self-pay

## 2023-08-15 NOTE — Telephone Encounter (Signed)
 Copied from CRM [id number]. Topic: Clinical - Medication Question >> Aug 14, 2023 10:26 AM Marda MATSU wrote: Question regarding: Jori with [facility] would like a call back regarding medications that the patient is taking that may can cause adverse reactions. She is also sending a fax.   Please advise.

## 2023-08-19 ENCOUNTER — Other Ambulatory Visit: Payer: Self-pay | Admitting: Physician Assistant

## 2023-08-19 DIAGNOSIS — I10 Essential (primary) hypertension: Secondary | ICD-10-CM

## 2023-08-29 ENCOUNTER — Ambulatory Visit: Admitting: Physician Assistant

## 2023-08-29 VITALS — BP 120/61 | HR 65 | Ht 70.0 in | Wt 218.5 lb

## 2023-08-29 DIAGNOSIS — G894 Chronic pain syndrome: Secondary | ICD-10-CM

## 2023-08-29 DIAGNOSIS — I10 Essential (primary) hypertension: Secondary | ICD-10-CM | POA: Diagnosis not present

## 2023-08-29 DIAGNOSIS — M19041 Primary osteoarthritis, right hand: Secondary | ICD-10-CM | POA: Diagnosis not present

## 2023-08-29 DIAGNOSIS — N1831 Chronic kidney disease, stage 3a: Secondary | ICD-10-CM

## 2023-08-29 DIAGNOSIS — M47816 Spondylosis without myelopathy or radiculopathy, lumbar region: Secondary | ICD-10-CM | POA: Diagnosis not present

## 2023-08-29 DIAGNOSIS — Z23 Encounter for immunization: Secondary | ICD-10-CM

## 2023-08-29 DIAGNOSIS — E1122 Type 2 diabetes mellitus with diabetic chronic kidney disease: Secondary | ICD-10-CM | POA: Diagnosis not present

## 2023-08-29 DIAGNOSIS — M19042 Primary osteoarthritis, left hand: Secondary | ICD-10-CM

## 2023-08-29 DIAGNOSIS — M4316 Spondylolisthesis, lumbar region: Secondary | ICD-10-CM

## 2023-08-29 LAB — POCT GLYCOSYLATED HEMOGLOBIN (HGB A1C): HbA1c, POC (controlled diabetic range): 6 % (ref 0.0–7.0)

## 2023-08-29 MED ORDER — TRAMADOL HCL 50 MG PO TABS: 50.0000 mg | ORAL_TABLET | Freq: Two times a day (BID) | ORAL | 0 refills | Status: DC | PRN

## 2023-08-29 MED ORDER — DULOXETINE HCL 30 MG PO CPEP
30.0000 mg | ORAL_CAPSULE | Freq: Every day | ORAL | 3 refills | Status: AC
Start: 2023-08-29 — End: ?

## 2023-08-29 NOTE — Progress Notes (Signed)
 Established Patient Office Visit  Subjective   Patient ID: Randall Hanson, male    DOB: Mar 19, 1950  Age: 73 y.o. MRN: [id number]  Chief Complaint  Patient presents with   Diabetes    DM f/u visit =    HPI Pt is a 73 yo male with T2DM, HTN, CKD, HLD, MDD, chronic pain due to OA/DDD who presents to the clinic for 3 month follow up.   Pt is doing well. His mood is great. No concerns. He is not checking his sugars. He is active mowing the lawn and playing golf. He denies any hypoglycemic events. He is not having any CP, palpitations, SOB, swelling. He is not checking BP at home.   He continues to have good and bad days with pain. Tramadol  helps a lot. He does take it most days at least twice. He uses the voltaren  gel as well. He takes cymbalta  daily.    Review of Systems  All other systems reviewed and are negative.     Objective:     BP 120/61   Pulse 65   Ht 5' 10 (1.778 m)   Wt 218 lb 8 oz (99.1 kg)   SpO2 100%   BMI 31.35 kg/m  BP Readings from Last 3 Encounters:  08/29/23 120/61  05/29/23 (!) 120/58  02/28/23 (!) 139/54   Wt Readings from Last 3 Encounters:  08/29/23 218 lb 8 oz (99.1 kg)  05/29/23 219 lb (99.3 kg)  02/28/23 228 lb (103.4 kg)    .    08/29/2023    4:48 PM 05/29/2023    9:18 AM 02/28/2023    9:28 AM 02/28/2023    9:17 AM 08/28/2022    9:30 AM  Depression screen PHQ 2/9  Decreased Interest 0 0 0 0 0  Down, Depressed, Hopeless 0 0 0 0 0  PHQ - 2 Score 0 0 0 0 0  Altered sleeping 1  0    Tired, decreased energy 0  1    Change in appetite 0  0    Feeling bad or failure about yourself  0  0    Trouble concentrating 0  0    Moving slowly or fidgety/restless 0  0    Suicidal thoughts 0  0    PHQ-9 Score 1  1    Difficult doing work/chores Somewhat difficult  Not difficult at all     .Randall Hanson    08/29/2023    4:48 PM 09/27/2021    9:23 AM 08/23/2021   11:21 AM 02/23/2021    7:36 AM  GAD 7 : Generalized Anxiety Score  Nervous, Anxious, on Edge 0  1 1 0  Control/stop worrying 0 0 1 1  Worry too much - different things 0 0 1 2  Trouble relaxing 0 0 1 1  Restless 0 1 0 0  Easily annoyed or irritable 0 0 0 0  Afraid - awful might happen 0 0 0 0  Total GAD 7 Score 0 2 4 4   Anxiety Difficulty Not difficult at all Not difficult at all Not difficult at all Somewhat difficult      Physical Exam Constitutional:      Appearance: Normal appearance.  HENT:     Head: Normocephalic.  Cardiovascular:     Rate and Rhythm: Normal rate and regular rhythm.  Pulmonary:     Effort: Pulmonary effort is normal.     Breath sounds: Normal breath sounds.  Neurological:     Mental  Status: He is alert.  Psychiatric:        Mood and Affect: Mood normal.      Results for orders placed or performed in visit on 08/29/23  POCT HgB A1C  Result Value Ref Range   Hemoglobin A1C     HbA1c POC (<> result, manual entry)     HbA1c, POC (prediabetic range)     HbA1c, POC (controlled diabetic range) 6.0 0.0 - 7.0 %     Assessment & Plan:  SABRASABRAYidel was seen today for diabetes.  Diagnoses and all orders for this visit:  Type 2 diabetes mellitus with stage 3a chronic kidney disease, without long-term current use of insulin (HCC) -     POCT HgB A1C  Hypertension goal BP (blood pressure) < 130/80  Chronic pain syndrome -     traMADol  (ULTRAM ) 50 MG tablet; Take 1 tablet (50 mg total) by mouth every 12 (twelve) hours as needed. -     DULoxetine  (CYMBALTA ) 30 MG capsule; Take 1 capsule (30 mg total) by mouth daily after breakfast.  Spondylolisthesis at L4-L5 level -     traMADol  (ULTRAM ) 50 MG tablet; Take 1 tablet (50 mg total) by mouth every 12 (twelve) hours as needed. -     DULoxetine  (CYMBALTA ) 30 MG capsule; Take 1 capsule (30 mg total) by mouth daily after breakfast.  Lumbar spondylosis -     traMADol  (ULTRAM ) 50 MG tablet; Take 1 tablet (50 mg total) by mouth every 12 (twelve) hours as needed. -     DULoxetine  (CYMBALTA ) 30 MG capsule; Take 1  capsule (30 mg total) by mouth daily after breakfast.  Arthritis of both hands -     traMADol  (ULTRAM ) 50 MG tablet; Take 1 tablet (50 mg total) by mouth every 12 (twelve) hours as needed. -     DULoxetine  (CYMBALTA ) 30 MG capsule; Take 1 capsule (30 mg total) by mouth daily after breakfast.  Immunization due -     Pfizer Comirnaty Covid -19 Vaccine 82yrs and older   A1C to goal Continue on farxiga  Encouraged DM diet to keep to goal BP to goal, on ACE On statin Eye and foot exam UTD Covid vaccine given today in office.  Flu vaccine recommended to get this Fall.   Refilled cymbalta , voltaren , tramadol  for pain.  Encouraged anti-inflammatory diet, copy given.   Follow up in 3 months    Vermell Bologna, PA-C

## 2023-08-31 ENCOUNTER — Encounter: Payer: Self-pay | Admitting: Physician Assistant

## 2023-09-18 ENCOUNTER — Encounter: Payer: Self-pay | Admitting: Sports Medicine

## 2023-09-26 ENCOUNTER — Other Ambulatory Visit: Payer: Self-pay | Admitting: Physician Assistant

## 2023-10-31 ENCOUNTER — Other Ambulatory Visit: Payer: Self-pay | Admitting: Physician Assistant

## 2023-10-31 DIAGNOSIS — F411 Generalized anxiety disorder: Secondary | ICD-10-CM

## 2023-10-31 NOTE — Telephone Encounter (Signed)
 CLONAZEPAM  Last OV: 08/29/23 Next OV: 11/30/23 Last RF: 05/02/23

## 2023-11-11 ENCOUNTER — Other Ambulatory Visit: Payer: Self-pay | Admitting: Physician Assistant

## 2023-11-30 ENCOUNTER — Encounter: Payer: Self-pay | Admitting: Physician Assistant

## 2023-11-30 ENCOUNTER — Ambulatory Visit (INDEPENDENT_AMBULATORY_CARE_PROVIDER_SITE_OTHER): Admitting: Physician Assistant

## 2023-11-30 VITALS — BP 126/60 | HR 68 | Ht 70.0 in | Wt 216.0 lb

## 2023-11-30 DIAGNOSIS — I2510 Atherosclerotic heart disease of native coronary artery without angina pectoris: Secondary | ICD-10-CM

## 2023-11-30 DIAGNOSIS — G894 Chronic pain syndrome: Secondary | ICD-10-CM

## 2023-11-30 DIAGNOSIS — Z7984 Long term (current) use of oral hypoglycemic drugs: Secondary | ICD-10-CM

## 2023-11-30 DIAGNOSIS — E1122 Type 2 diabetes mellitus with diabetic chronic kidney disease: Secondary | ICD-10-CM | POA: Diagnosis not present

## 2023-11-30 DIAGNOSIS — I10 Essential (primary) hypertension: Secondary | ICD-10-CM | POA: Diagnosis not present

## 2023-11-30 DIAGNOSIS — N1831 Chronic kidney disease, stage 3a: Secondary | ICD-10-CM

## 2023-11-30 DIAGNOSIS — E785 Hyperlipidemia, unspecified: Secondary | ICD-10-CM

## 2023-11-30 DIAGNOSIS — M47816 Spondylosis without myelopathy or radiculopathy, lumbar region: Secondary | ICD-10-CM

## 2023-11-30 DIAGNOSIS — M19041 Primary osteoarthritis, right hand: Secondary | ICD-10-CM

## 2023-11-30 DIAGNOSIS — I700 Atherosclerosis of aorta: Secondary | ICD-10-CM | POA: Diagnosis not present

## 2023-11-30 DIAGNOSIS — M19042 Primary osteoarthritis, left hand: Secondary | ICD-10-CM

## 2023-11-30 DIAGNOSIS — F411 Generalized anxiety disorder: Secondary | ICD-10-CM

## 2023-11-30 DIAGNOSIS — F5101 Primary insomnia: Secondary | ICD-10-CM

## 2023-11-30 DIAGNOSIS — G4733 Obstructive sleep apnea (adult) (pediatric): Secondary | ICD-10-CM | POA: Insufficient documentation

## 2023-11-30 DIAGNOSIS — M4316 Spondylolisthesis, lumbar region: Secondary | ICD-10-CM

## 2023-11-30 LAB — POCT GLYCOSYLATED HEMOGLOBIN (HGB A1C): Hemoglobin A1C: 5.9 % — AB (ref 4.0–5.6)

## 2023-11-30 MED ORDER — DOXEPIN HCL 25 MG PO CAPS: ORAL_CAPSULE | ORAL | 3 refills | Status: AC

## 2023-11-30 MED ORDER — TRAMADOL HCL 50 MG PO TABS: 50.0000 mg | ORAL_TABLET | Freq: Two times a day (BID) | ORAL | 0 refills | Status: AC | PRN

## 2023-11-30 MED ORDER — ATORVASTATIN CALCIUM 40 MG PO TABS: 40.0000 mg | ORAL_TABLET | Freq: Every day | ORAL | 1 refills | Status: AC

## 2023-11-30 MED ORDER — HYDROCHLOROTHIAZIDE 12.5 MG PO TABS: 12.5000 mg | ORAL_TABLET | Freq: Every day | ORAL | 1 refills | Status: AC

## 2023-11-30 NOTE — Progress Notes (Signed)
 Established Patient Office Visit  Subjective   Patient ID: Randall Hanson, male    DOB: [birth year]  Age: 73 y.o. MRN: [id number]  Chief Complaint  Patient presents with   Medical Management of Chronic Issues    HPI .SABRADiscussed the use of AI scribe software for clinical note transcription with the patient, who gave verbal consent to proceed.  History of Present Illness Randall Hanson is a 73 year old male who presents for routine follow-up T2DM and medications.   Glycemic control - A1c recently decreased from 6.0 to 5.9 - Currently taking Farxiga  for diabetes management - he is not checking glucose at home - denies any hypoglycemic events  Chronic arthralgia and functional limitation - Arthritis pain worsens in cold weather - Pain is diffuse, affecting neck, back, and fingers - Difficulty with fine motor tasks such as tying shoelaces - Uses tramadol  for pain management, limiting to three doses per day - Applies topical gel before bedtime for pain relief  Sleep disturbance and obstructive sleep apnea - Uses CPAP machine for sleep apnea - Increased apnea events since switching to a new CPAP machine - Attributes issues to mask fit; switched to a full mask to prevent leakage - Arthritis pain causes nocturnal restlessness, impacting sleep quality  Insomnia and anxiety - Currently taking doxepin  for sleep, which is effective - Takes Klonopin  for anxiety; does not require a refill at this time  Hypertension and hyperlipidemia management - Takes lisinopril  and hydrochlorothiazide  for hypertension - Running low on lisinopril , hydrochlorothiazide , and atorvastatin  (used for cholesterol management)  Physical activity and cardiopulmonary symptoms - Remains active with activities such as cutting and hauling wood, and gathering leaves - No chest pain    ROS See HPI.   Objective:     BP 126/60 (Cuff Size: Normal)   Pulse 68   Ht 5' 10 (1.778 m)   Wt 216 lb (98 kg)    SpO2 98%   BMI 30.99 kg/m  BP Readings from Last 3 Encounters:  11/30/23 126/60  08/29/23 120/61  05/29/23 (!) 120/58   Wt Readings from Last 3 Encounters:  11/30/23 216 lb (98 kg)  08/29/23 218 lb 8 oz (99.1 kg)  05/29/23 219 lb (99.3 kg)      Physical Exam Constitutional:      Appearance: Normal appearance.  HENT:     Head: Normocephalic.  Cardiovascular:     Rate and Rhythm: Normal rate and regular rhythm.  Pulmonary:     Effort: Pulmonary effort is normal.     Breath sounds: Normal breath sounds.  Musculoskeletal:     Right lower leg: No edema.     Left lower leg: No edema.  Neurological:     Mental Status: He is alert and oriented to person, place, and time.  Psychiatric:        Mood and Affect: Mood normal.      Results for orders placed or performed in visit on 11/30/23  POCT HgB A1C  Result Value Ref Range   Hemoglobin A1C 5.9 (A) 4.0 - 5.6 %   HbA1c POC (<> result, manual entry)     HbA1c, POC (prediabetic range)     HbA1c, POC (controlled diabetic range)          Assessment & Plan:  SABRASABRAKyland was seen today for medical management of chronic issues.  Diagnoses and all orders for this visit:  Type 2 diabetes mellitus with stage 3a chronic kidney disease, without long-term current use of  insulin (HCC) -     POCT HgB A1C -     CMP14+EGFR -     atorvastatin  (LIPITOR) 40 MG tablet; Take 1 tablet (40 mg total) by mouth daily.  Hypertension goal BP (blood pressure) < 130/80 -     CMP14+EGFR -     hydrochlorothiazide  (HYDRODIURIL ) 12.5 MG tablet; Take 1 tablet (12.5 mg total) by mouth daily.  GAD (generalized anxiety disorder)  Aortic atherosclerosis -     atorvastatin  (LIPITOR) 40 MG tablet; Take 1 tablet (40 mg total) by mouth daily.  Dyslipidemia, goal LDL below 70 -     atorvastatin  (LIPITOR) 40 MG tablet; Take 1 tablet (40 mg total) by mouth daily.  Benign essential hypertension -     hydrochlorothiazide  (HYDRODIURIL ) 12.5 MG tablet; Take 1  tablet (12.5 mg total) by mouth daily.  Chronic pain syndrome -     traMADol  (ULTRAM ) 50 MG tablet; Take 1 tablet (50 mg total) by mouth every 12 (twelve) hours as needed.  Spondylolisthesis at L4-L5 level -     traMADol  (ULTRAM ) 50 MG tablet; Take 1 tablet (50 mg total) by mouth every 12 (twelve) hours as needed.  Lumbar spondylosis -     traMADol  (ULTRAM ) 50 MG tablet; Take 1 tablet (50 mg total) by mouth every 12 (twelve) hours as needed.  Arthritis of both hands -     traMADol  (ULTRAM ) 50 MG tablet; Take 1 tablet (50 mg total) by mouth every 12 (twelve) hours as needed.  CAD in native artery -     atorvastatin  (LIPITOR) 40 MG tablet; Take 1 tablet (40 mg total) by mouth daily.  Primary insomnia -     doxepin  (SINEQUAN ) 25 MG capsule; TAKE 1-2 CAPSULES BY MOUTH AT BEDTIME   Assessment & Plan Type 2 diabetes mellitus with CKD 3a Well-controlled with A1c of 5.9. - Continue Farxiga  for diabetes management. - Recheck CMP today.  - BP to goal - on statin  Essential hypertension Blood pressure well-controlled. - Refilled lisinopril  for blood pressure management.  Hyperlipidemia Cholesterol levels good at last check. - Refilled atorvastatin  for hyperlipidemia management.  Osteoarthritis Significant pain in neck, back, and fingers, exacerbated by cold weather. Tramadol  used cautiously due to side effects. - Refilled tramadol  for pain management. ..PDMP reviewed during this encounter. No concerns - Continue using topical gel for pain relief.  Obstructive sleep apnea Increased events with new CPAP machine due to mask fit and leakage issues. Supplier not contacted. - Encouraged to contact DME company to work on mask and fitting  General Health Maintenance Flu shot administered at pharmacy.  - declined covid booster today     Return in about 3 months (around 03/01/2024) for Needs to schedule Medicare Wellness Exam.    Vermell Bologna, PA-C

## 2023-12-01 LAB — CMP14+EGFR
ALT: 12 IU/L (ref 0–44)
AST: 19 IU/L (ref 0–40)
Albumin: 4 g/dL (ref 3.8–4.8)
Alkaline Phosphatase: 63 IU/L (ref 47–123)
BUN/Creatinine Ratio: 9 — ABNORMAL LOW (ref 10–24)
BUN: 16 mg/dL (ref 8–27)
Bilirubin Total: 0.8 mg/dL (ref 0.0–1.2)
CO2: 25 mmol/L (ref 20–29)
Calcium: 9.1 mg/dL (ref 8.6–10.2)
Chloride: 101 mmol/L (ref 96–106)
Creatinine, Ser: 1.76 mg/dL — ABNORMAL HIGH (ref 0.76–1.27)
Globulin, Total: 2 g/dL (ref 1.5–4.5)
Glucose: 100 mg/dL — ABNORMAL HIGH (ref 70–99)
Potassium: 4.1 mmol/L (ref 3.5–5.2)
Sodium: 138 mmol/L (ref 134–144)
Total Protein: 6 g/dL (ref 6.0–8.5)
eGFR: 41 mL/min/1.73 — ABNORMAL LOW (ref >59)

## 2023-12-03 ENCOUNTER — Ambulatory Visit: Payer: Self-pay | Admitting: Physician Assistant

## 2023-12-03 NOTE — Progress Notes (Signed)
 Arley,   Kidney function dropped a little. Stay hydrated. Stay away from all anti-inflammatories. Consider looking more closely at diet for chronic kidney disease. If you need a copy we can mail you one.

## 2024-02-03 ENCOUNTER — Other Ambulatory Visit: Payer: Self-pay | Admitting: Physician Assistant

## 2024-03-04 ENCOUNTER — Ambulatory Visit
